# Patient Record
Sex: Female | Born: 1990 | Race: White | Hispanic: No | Marital: Single | Smoking: Never smoker
Health system: Southern US, Community
[De-identification: ages and names within clinical notes are randomized; demographics above are authoritative.]

## PROBLEM LIST (undated history)

## (undated) DIAGNOSIS — F32A Depression, unspecified: Secondary | ICD-10-CM

## (undated) DIAGNOSIS — F419 Anxiety disorder, unspecified: Secondary | ICD-10-CM

## (undated) DIAGNOSIS — T7840XA Allergy, unspecified, initial encounter: Secondary | ICD-10-CM

## (undated) HISTORY — DX: Allergy, unspecified, initial encounter: T78.40XA

## (undated) HISTORY — DX: Depression, unspecified: F32.A

## (undated) HISTORY — PX: DENTAL SURGERY: SHX609

## (undated) HISTORY — DX: Anxiety disorder, unspecified: F41.9

---

## 2013-11-26 ENCOUNTER — Ambulatory Visit: Payer: BC Managed Care – PPO

## 2013-11-26 ENCOUNTER — Ambulatory Visit: Payer: BC Managed Care – PPO | Admitting: Family Medicine

## 2013-11-26 VITALS — BP 112/72 | HR 81 | Temp 98.3°F | Resp 16 | Ht 65.75 in | Wt 167.4 lb

## 2013-11-26 DIAGNOSIS — M79609 Pain in unspecified limb: Secondary | ICD-10-CM

## 2013-11-26 DIAGNOSIS — M79672 Pain in left foot: Secondary | ICD-10-CM

## 2013-11-26 DIAGNOSIS — M25579 Pain in unspecified ankle and joints of unspecified foot: Secondary | ICD-10-CM

## 2013-11-26 DIAGNOSIS — M25572 Pain in left ankle and joints of left foot: Secondary | ICD-10-CM

## 2013-11-26 NOTE — Progress Notes (Signed)
Subjective: 23 year old lady who [place] [facility] [place] [facility] She tripped and twisted her left foot. He has had black and blue it continues to hurt. No prior injury. She is able to walk on it and it hurts.  Objective:  More I do not seem terribly tender. Foot has is swollen and has a bruised appearance on both the dorsal and ventral surfaces she has tenderness in the lateral aspect of the arch. There is pain on inversion, less pain on eversion. Dorsiflexion hurts a little bit and plantarflexion a little bit. The pain is not nearly as bad as the foot looks.  Assessment: Sprain right foot, rule out fracture Pain in the ankle  Plan: xrays  [facility] reading (PRIMARY) by  Dr. Alwyn RenHopper No fracture foot or ankle noted  Treat symptomatically with ice, NSAIDs,

## 2013-11-26 NOTE — Patient Instructions (Signed)
Wear the Ace wrap for a week or 10 days  Ibuprofen as needed for pain. Map maximum 2400 mg in 24 hour.  Return if not significantly improved in about 10-14 days  . Avoid vigorous exercises for another week or so.  Elevate as able

## 2014-04-15 ENCOUNTER — Encounter (HOSPITAL_COMMUNITY): Payer: Self-pay | Admitting: Emergency Medicine

## 2014-04-15 ENCOUNTER — Emergency Department (HOSPITAL_COMMUNITY): Payer: BC Managed Care – PPO

## 2014-04-15 ENCOUNTER — Emergency Department (HOSPITAL_COMMUNITY)
Admission: EM | Admit: 2014-04-15 | Discharge: 2014-04-16 | Disposition: A | Discharge status: Home / Self Care | Payer: BC Managed Care – PPO | Attending: Emergency Medicine | Admitting: Emergency Medicine

## 2014-04-15 DIAGNOSIS — R1031 Right lower quadrant pain: Secondary | ICD-10-CM | POA: Insufficient documentation

## 2014-04-15 DIAGNOSIS — R109 Unspecified abdominal pain: Secondary | ICD-10-CM

## 2014-04-15 DIAGNOSIS — Z3202 Encounter for pregnancy test, result negative: Secondary | ICD-10-CM | POA: Insufficient documentation

## 2014-04-15 DIAGNOSIS — K5900 Constipation, unspecified: Secondary | ICD-10-CM | POA: Insufficient documentation

## 2014-04-15 DIAGNOSIS — Z79899 Other long term (current) drug therapy: Secondary | ICD-10-CM | POA: Insufficient documentation

## 2014-04-15 DIAGNOSIS — R197 Diarrhea, unspecified: Secondary | ICD-10-CM | POA: Insufficient documentation

## 2014-04-15 LAB — POC URINE PREG, ED: Preg Test, Ur: NEGATIVE

## 2014-04-15 LAB — COMPREHENSIVE METABOLIC PANEL
ALBUMIN: 3.7 g/dL (ref 3.5–5.2)
ALT: 17 U/L (ref 0–35)
AST: 20 U/L (ref 0–37)
Alkaline Phosphatase: 92 U/L (ref 39–117)
BILIRUBIN TOTAL: 0.3 mg/dL (ref 0.3–1.2)
BUN: 10 mg/dL (ref 6–23)
CHLORIDE: 103 meq/L (ref 96–112)
CO2: 24 mEq/L (ref 19–32)
CREATININE: 0.56 mg/dL (ref 0.50–1.10)
Calcium: 9.9 mg/dL (ref 8.4–10.5)
GFR calc Af Amer: >90 mL/min (ref >90)
GFR calc non Af Amer: >90 mL/min (ref >90)
Glucose, Bld: 88 mg/dL (ref 70–99)
POTASSIUM: 4.3 meq/L (ref 3.7–5.3)
Sodium: 142 mEq/L (ref 137–147)
TOTAL PROTEIN: 7.7 g/dL (ref 6.0–8.3)

## 2014-04-15 LAB — CBC WITH DIFFERENTIAL/PLATELET
BASOS ABS: 0 10*3/uL (ref 0.0–0.1)
Basophils Relative: 0 % (ref 0–1)
EOS ABS: 0.1 10*3/uL (ref 0.0–0.7)
Eosinophils Relative: 1 % (ref 0–5)
HCT: 38.8 % (ref 36.0–46.0)
Hemoglobin: 12.6 g/dL (ref 12.0–15.0)
Lymphocytes Relative: 34 % (ref 12–46)
Lymphs Abs: 3.3 10*3/uL (ref 0.7–4.0)
MCH: 29.2 pg (ref 26.0–34.0)
MCHC: 32.5 g/dL (ref 30.0–36.0)
MCV: 90 fL (ref 78.0–100.0)
MONO ABS: 0.6 10*3/uL (ref 0.1–1.0)
MONOS PCT: 6 % (ref 3–12)
NEUTROS PCT: 59 % (ref 43–77)
Neutro Abs: 5.5 10*3/uL (ref 1.7–7.7)
Platelets: 305 10*3/uL (ref 150–400)
RBC: 4.31 MIL/uL (ref 3.87–5.11)
RDW: 13.1 % (ref 11.5–15.5)
WBC: 9.5 10*3/uL (ref 4.0–10.5)

## 2014-04-15 LAB — URINE MICROSCOPIC-ADD ON

## 2014-04-15 LAB — URINALYSIS, ROUTINE W REFLEX MICROSCOPIC
Bilirubin Urine: NEGATIVE
GLUCOSE, UA: NEGATIVE mg/dL
Ketones, ur: NEGATIVE mg/dL
LEUKOCYTES UA: NEGATIVE
Nitrite: NEGATIVE
Protein, ur: NEGATIVE mg/dL
Specific Gravity, Urine: 1.015 (ref 1.005–1.030)
Urobilinogen, UA: 0.2 mg/dL (ref 0.0–1.0)
pH: 6.5 (ref 5.0–8.0)

## 2014-04-15 MED ORDER — IOHEXOL 300 MG/ML  SOLN
25.0000 mL | Freq: Once | INTRAMUSCULAR | Status: AC | PRN
  Administered 2014-04-15: 25 mL via ORAL

## 2014-04-15 MED ORDER — HYDROCODONE-ACETAMINOPHEN 5-325 MG PO TABS: 1.0000 | ORAL_TABLET | ORAL | Status: DC | PRN

## 2014-04-15 MED ORDER — MORPHINE SULFATE 4 MG/ML IJ SOLN
2.0000 mg | Freq: Once | INTRAMUSCULAR | Status: AC
  Administered 2014-04-15: 2 mg via INTRAVENOUS
  Filled 2014-04-15: qty 1

## 2014-04-15 MED ORDER — IOHEXOL 300 MG/ML  SOLN
100.0000 mL | Freq: Once | INTRAMUSCULAR | Status: AC | PRN
Start: 2014-04-15 — End: 2014-04-15
  Administered 2014-04-15: 100 mL via INTRAVENOUS

## 2014-04-15 NOTE — Discharge Instructions (Signed)
Take Vicodin as needed for severe pain - Please be careful with this medication.  It can cause drowsiness.  Use caution while driving, operating machinery, drinking alcohol, or any other activities that may impair your physical or mental abilities.   Return to the emergency department if you develop any changing/worsening condition, fever, change or worsening pain, repeated vomiting, feeling ill or worse or any other concerns (please read additional information regarding your condition below)   Abdominal Pain, Women Abdominal (stomach, pelvic, or belly) pain can be caused by many things. It is important to tell your doctor:  The location of the pain.  Does it come and go or is it present all the time?  Are there things that start the pain (eating certain foods, exercise)?  Are there other symptoms associated with the pain (fever, nausea, vomiting, diarrhea)? All of this is helpful to know when trying to find the cause of the pain. CAUSES   Stomach: virus or bacteria infection, or ulcer.  Intestine: appendicitis (inflamed appendix), regional ileitis (Crohn's disease), ulcerative colitis (inflamed colon), irritable bowel syndrome, diverticulitis (inflamed diverticulum of the colon), or cancer of the stomach or intestine.  Gallbladder disease or stones in the gallbladder.  Kidney disease, kidney stones, or infection.  Pancreas infection or cancer.  Fibromyalgia (pain disorder).  Diseases of the female organs:  Uterus: fibroid (non-cancerous) tumors or infection.  Fallopian tubes: infection or tubal pregnancy.  Ovary: cysts or tumors.  Pelvic adhesions (scar tissue).  Endometriosis (uterus lining tissue growing in the pelvis and on the pelvic organs).  Pelvic congestion syndrome (female organs filling up with blood just before the menstrual period).  Pain with the menstrual period.  Pain with ovulation (producing an egg).  Pain with an IUD (intrauterine device, birth  control) in the uterus.  Cancer of the female organs.  Functional pain (pain not caused by a disease, may improve without treatment).  Psychological pain.  Depression. DIAGNOSIS  Your doctor will decide the seriousness of your pain by doing an examination.  Blood tests.  X-rays.  Ultrasound.  CT scan (computed tomography, special type of X-ray).  MRI (magnetic resonance imaging).  Cultures, for infection.  Barium enema (dye inserted in the large intestine, to better view it with X-rays).  Colonoscopy (looking in intestine with a lighted tube).  Laparoscopy (minor surgery, looking in abdomen with a lighted tube).  Major abdominal exploratory surgery (looking in abdomen with a large incision). TREATMENT  The treatment will depend on the cause of the pain.   Many cases can be observed and treated at home.  Over-the-counter medicines recommended by your caregiver.  Prescription medicine.  Antibiotics, for infection.  Birth control pills, for painful periods or for ovulation pain.  Hormone treatment, for endometriosis.  Nerve blocking injections.  Physical therapy.  Antidepressants.  Counseling with a psychologist or psychiatrist.  Minor or major surgery. HOME CARE INSTRUCTIONS   Do not take laxatives, unless directed by your caregiver.  Take over-the-counter pain medicine only if ordered by your caregiver. Do not take aspirin because it can cause an upset stomach or bleeding.  Try a clear liquid diet (broth or water) as ordered by your caregiver. Slowly move to a bland diet, as tolerated, if the pain is related to the stomach or intestine.  Have a thermometer and take your temperature several times a day, and record it.  Bed rest and sleep, if it helps the pain.  Avoid sexual intercourse, if it causes pain.  Avoid stressful situations.  Keep your follow-up appointments and tests, as your caregiver orders.  If the pain does not go away with medicine  or surgery, you may try:  Acupuncture.  Relaxation exercises (yoga, meditation).  Group therapy.  Counseling. SEEK MEDICAL CARE IF:   You notice certain foods cause stomach pain.  Your home care treatment is not helping your pain.  You need stronger pain medicine.  You want your IUD removed.  You feel faint or lightheaded.  You develop nausea and vomiting.  You develop a rash.  You are having side effects or an allergy to your medicine. SEEK IMMEDIATE MEDICAL CARE IF:   Your pain does not go away or gets worse.  You have a fever.  Your pain is felt only in portions of the abdomen. The right side could possibly be appendicitis. The left lower portion of the abdomen could be colitis or diverticulitis.  You are passing blood in your stools (bright red or black tarry stools, with or without vomiting).  You have blood in your urine.  You develop chills, with or without a fever.  You pass out. MAKE SURE YOU:   Understand these instructions.  Will watch your condition.  Will get help right away if you are not doing well or get worse. Document Released: 08/19/2007 Document Revised: 01/14/2012 Document Reviewed: 09/08/2009 [facility] Patient Information 2014 [place].

## 2014-04-15 NOTE — ED Notes (Signed)
The patient went to urgent care and was sent here due to abdominal pain, rule out appendicitis.  The patient denies N/V, diarrhea or any other symptoms other than right lower abdominal pain.

## 2014-04-15 NOTE — ED Provider Notes (Signed)
CSN: [id number]     Arrival date & time 04/15/14  1632 History   First MD Initiated Contact with Patient 04/15/14 2102     Chief Complaint  Patient presents with  . Abdominal Pain    The patient went to urgent care and was sent here due to abdominal pain, rule out appendicitis    HPI  Ruth Hogan is a 23 y.o. female with no PMH who presents to the ED for evaluation of abdominal pain. History was provided by the patient. Patient states she developed non-radiating RLQ pain when she woke up this morning. Pain has been constant and gradually worsening. Pain is described as an aching pain worse with movement. She did not take anything for pain. Pain improved by sitting upright. States her pain became worse at work and went to [facility] for further evaluation. Patient sent to ED to rule out appendicitis. Patient denies any previous abdominal surgeries. She otherwise has been feeling well. No fevers, chills, change in appetite/activity, nausea, emesis, dysuria, or vaginal discharge. Has alternating constipation and diarrhea at baseline with a normal BM yesterday. She is currently on her menstrual cycle. She denies any previous sexual history. States recently started a new kickboxing routine but denies any trauma/injuries.    History reviewed. No pertinent past medical history. Past Surgical History  Procedure Laterality Date  . Dental surgery     History reviewed. No pertinent family history. History  Substance Use Topics  . Smoking status: Never Smoker   . Smokeless tobacco: Not on file  . Alcohol Use: 0.5 - 1.0 oz/week    1-2 drink(s) per week   OB History   Grav Para Term Preterm Abortions TAB SAB Ect Mult Living                 Review of Systems  Constitutional: Negative for fever, chills, activity change, appetite change and fatigue.  Respiratory: Negative for cough and shortness of breath.   Cardiovascular: Negative for chest pain and leg swelling.  Gastrointestinal: Positive for  abdominal pain, diarrhea and constipation. Negative for nausea, vomiting and blood in stool.  Genitourinary: Positive for vaginal bleeding. Negative for dysuria, frequency, flank pain, decreased urine volume, vaginal discharge, difficulty urinating, genital sores, vaginal pain and pelvic pain.  Musculoskeletal: Negative for back pain.  Neurological: Negative for dizziness, weakness, light-headedness and headaches.     Allergies  Review of patient's allergies indicates no known allergies.  Home Medications   Prior to Admission medications   Medication Sig Start Date End Date Taking? Authorizing Provider  fexofenadine (ALLEGRA) 180 MG tablet Take 180 mg by mouth daily.   Yes Historical Provider, MD  Norgestimate-Ethinyl Estradiol Triphasic (ORTHO TRI-CYCLEN LO) 0.18/0.215/0.25 MG-25 MCG tab Take 1 tablet by mouth daily.   Yes Historical Provider, MD   BP 118/85  Pulse 81  Temp(Src) 97.9 F (36.6 C) (Oral)  Resp 14  Ht 5\' 6"  (1.676 m)  Wt 177 lb (80.287 kg)  BMI 28.58 kg/m2  SpO2 99%  LMP 04/15/2014  Filed Vitals:   04/15/14 2131 04/15/14 2145 04/15/14 2245 04/15/14 2355  BP: 119/78 122/73 118/85 106/54  Pulse: 60 68 81 67  Temp:      TempSrc:      Resp: 20 14  16   Height:      Weight:      SpO2: 100% 99% 99% 98%    Physical Exam  Nursing note and vitals reviewed. Constitutional: She is oriented to person, place, and time. She  appears well-developed and well-nourished. No distress.  HENT:  Head: Normocephalic and atraumatic.  Right Ear: External ear normal.  Left Ear: External ear normal.  Mouth/Throat: Oropharynx is clear and moist.  Eyes: Conjunctivae are normal. Right eye exhibits no discharge. Left eye exhibits no discharge.  Neck: Normal range of motion. Neck supple.  Cardiovascular: Normal rate, regular rhythm and normal heart sounds.  Exam reveals no gallop and no friction rub.   No murmur heard. Pulmonary/Chest: Effort normal and breath sounds normal. No  respiratory distress. She has no wheezes. She has no rales. She exhibits no tenderness.  Abdominal: Soft. She exhibits no distension and no mass. There is tenderness. There is no rebound and no guarding.  Mild RLQ tenderness to palpation. No peritoneal signs.   Musculoskeletal: Normal range of motion. She exhibits no edema and no tenderness.  Neurological: She is alert and oriented to person, place, and time.  Skin: Skin is warm and dry. She is not diaphoretic.    ED Course  Procedures (including critical care time) Labs Review Labs Reviewed  URINALYSIS, ROUTINE W REFLEX MICROSCOPIC - Abnormal; Notable for the following:    Hgb urine dipstick LARGE (*)    All other components within normal limits  CBC WITH DIFFERENTIAL  COMPREHENSIVE METABOLIC PANEL  URINE MICROSCOPIC-ADD ON  POC URINE PREG, ED    Imaging Review Ct Abdomen Pelvis W Contrast  04/15/2014   CLINICAL DATA:  Right lower quadrant pain.  EXAM: CT ABDOMEN AND PELVIS WITH CONTRAST  TECHNIQUE: Multidetector CT imaging of the abdomen and pelvis was performed using the standard protocol following bolus administration of intravenous contrast.  CONTRAST:  OMNIPAQUE IOHEXOL 300 MG/ML  SOLN  COMPARISON:  None.  FINDINGS: BODY WALL: Unremarkable.  LOWER CHEST: Unremarkable.  ABDOMEN/PELVIS:  Liver: No focal abnormality.  Biliary: No evidence of biliary obstruction or stone.  Pancreas: Unremarkable.  Spleen: Unremarkable.  Adrenals: Unremarkable.  Kidneys and ureters: No hydronephrosis or stone.  Bladder: Unremarkable.  Reproductive: Unremarkable.  Tampon.  Bowel: No obstruction. The appendix cannot be discretely visualize, but there is no significant appendiceal inflammation.  Retroperitoneum: No mass or adenopathy.  Peritoneum: No free fluid or gas.  Vascular: No acute abnormality.  OSSEOUS: No acute abnormalities.  IMPRESSION: Negative exam. No evidence for appendicitis or other inflammatory process in the abdomen or pelvis.    Electronically Signed   By: Davonna Belling M.D.   On: 04/15/2014 22:53     EKG Interpretation None      Results for orders placed during the hospital encounter of 04/15/14  CBC WITH DIFFERENTIAL      Result Value Ref Range   WBC 9.5  4.0 - 10.5 K/uL   RBC 4.31  3.87 - 5.11 MIL/uL   Hemoglobin 12.6  12.0 - 15.0 g/dL   HCT 50.1  58.6 - 82.5 %   MCV 90.0  78.0 - 100.0 fL   MCH 29.2  26.0 - 34.0 pg   MCHC 32.5  30.0 - 36.0 g/dL   RDW 74.9  35.5 - 21.7 %   Platelets 305  150 - 400 K/uL   Neutrophils Relative % 59  43 - 77 %   Neutro Abs 5.5  1.7 - 7.7 K/uL   Lymphocytes Relative 34  12 - 46 %   Lymphs Abs 3.3  0.7 - 4.0 K/uL   Monocytes Relative 6  3 - 12 %   Monocytes Absolute 0.6  0.1 - 1.0 K/uL   Eosinophils Relative 1  0 - 5 %   Eosinophils Absolute 0.1  0.0 - 0.7 K/uL   Basophils Relative 0  0 - 1 %   Basophils Absolute 0.0  0.0 - 0.1 K/uL  COMPREHENSIVE METABOLIC PANEL      Result Value Ref Range   Sodium 142  137 - 147 mEq/L   Potassium 4.3  3.7 - 5.3 mEq/L   Chloride 103  96 - 112 mEq/L   CO2 24  19 - 32 mEq/L   Glucose, Bld 88  70 - 99 mg/dL   BUN 10  6 - 23 mg/dL   Creatinine, Ser 1.61  0.50 - 1.10 mg/dL   Calcium 9.9  8.4 - 09.6 mg/dL   Total Protein 7.7  6.0 - 8.3 g/dL   Albumin 3.7  3.5 - 5.2 g/dL   AST 20  0 - 37 U/L   ALT 17  0 - 35 U/L   Alkaline Phosphatase 92  39 - 117 U/L   Total Bilirubin 0.3  0.3 - 1.2 mg/dL   GFR calc non Af Amer >90  >90 mL/min   GFR calc Af Amer >90  >90 mL/min  URINALYSIS, ROUTINE W REFLEX MICROSCOPIC      Result Value Ref Range   Color, Urine YELLOW  YELLOW   APPearance CLEAR  CLEAR   Specific Gravity, Urine 1.015  1.005 - 1.030   pH 6.5  5.0 - 8.0   Glucose, UA NEGATIVE  NEGATIVE mg/dL   Hgb urine dipstick LARGE (*) NEGATIVE   Bilirubin Urine NEGATIVE  NEGATIVE   Ketones, ur NEGATIVE  NEGATIVE mg/dL   Protein, ur NEGATIVE  NEGATIVE mg/dL   Urobilinogen, UA 0.2  0.0 - 1.0 mg/dL   Nitrite NEGATIVE  NEGATIVE   Leukocytes,  UA NEGATIVE  NEGATIVE  URINE MICROSCOPIC-ADD ON      Result Value Ref Range   Squamous Epithelial / LPF RARE  RARE   RBC / HPF TOO NUMEROUS TO COUNT  <3 RBC/hpf   Bacteria, UA RARE  RARE  POC URINE PREG, ED      Result Value Ref Range   Preg Test, Ur NEGATIVE  NEGATIVE    MDM   SYRAH DAUGHTREY is a 23 y.o. female with no PMH who presents to the ED for evaluation of abdominal pain. Etiology of abdominal pain unclear. CT negative for appendicitis or other acute abnormality. Patient had improvements in her pain throughout her ED visit. Abdominal exam benign. Pelvic exam deferred by patient. Doubt PID, ovarian torsion, or other life-threatening gynecological causes. Labs unremarkable. Patient afebrile and non-toxic in appearance. Vital signs stable. Patient has hematuria which is likely due to her menstrual period. Patient given strict return precautions. May require pelvic exam and pelvic US if her pain continues or does not improve. Instructed patient to follow-up with PCP tomorrow and return if she has any changing or worsening symptoms. Patient in agreement with discharge and plan.   Rechecks  11:45 PM = Patient informed of results. Declines pelvic exam. States she feels better, however, pain has not resolved. Patient well appearing and is in no acute distress. Abdominal exam benign. Spoke with patient regarding return precautions.     Discharge Medication List as of 04/15/2014 11:52 PM    START taking these medications   Details  HYDROcodone-acetaminophen (NORCO/VICODIN) 5-325 MG per tablet Take 1 tablet by mouth every 4 (four) hours as needed for moderate pain or severe pain., Starting 04/15/2014, Until Discontinued, Print  Final impressions: 1. Abdominal pain       Luiz IronJessica Katlin Bianca Vester PA-C   This patient was discussed with Dr. Skipper ClicheZackowski         Glennon Kopko K Marian Grandt, PA-C 04/16/14 [phone]

## 2014-04-17 NOTE — ED Provider Notes (Signed)
Medical screening examination/treatment/procedure(s) were performed by non-physician practitioner and as supervising physician I was immediately available for consultation/collaboration.   EKG Interpretation None        Vanetta MuldersScott Jadie Allington, MD 04/17/14 1635

## 2014-07-14 IMAGING — CR DG FOOT COMPLETE 3+V*L*
3 series · 3 of 3 positions shown · non-contrast
Comparison: None.

CLINICAL DATA: Left foot injury, pain.

EXAM:
LEFT FOOT - COMPLETE 3+ VIEW

[AP]
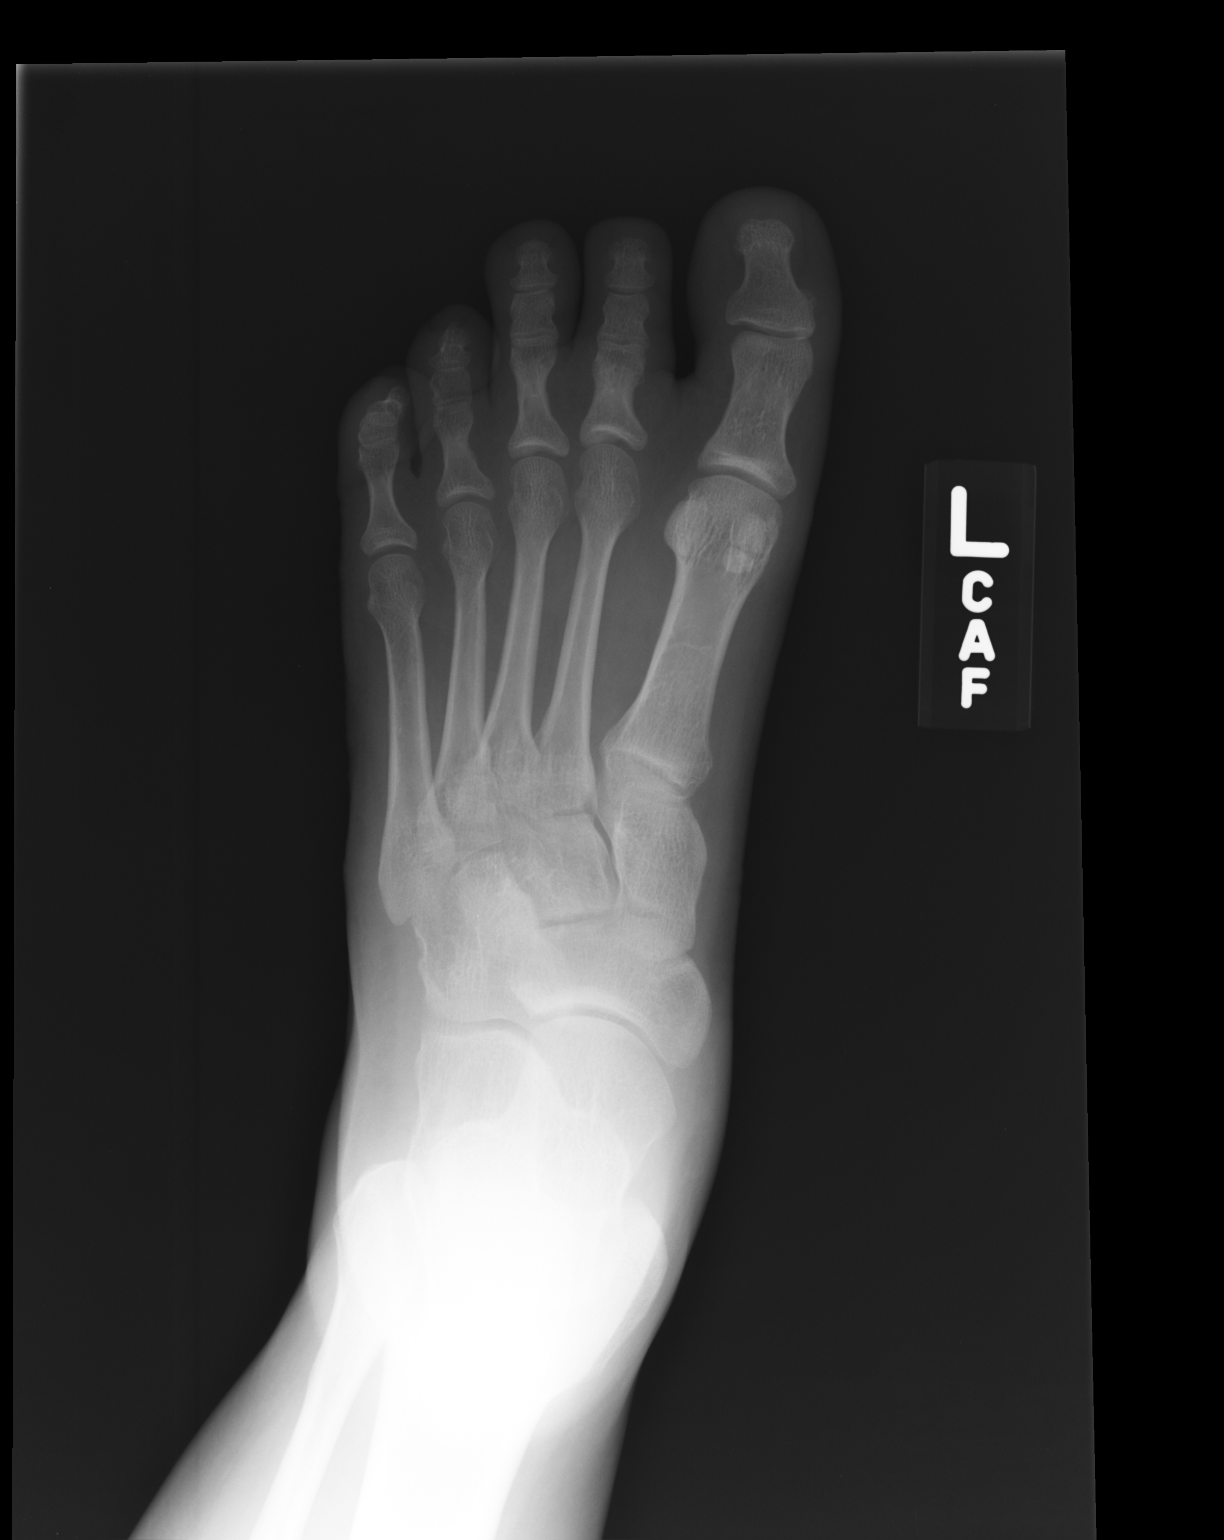

[ap obl int rot]
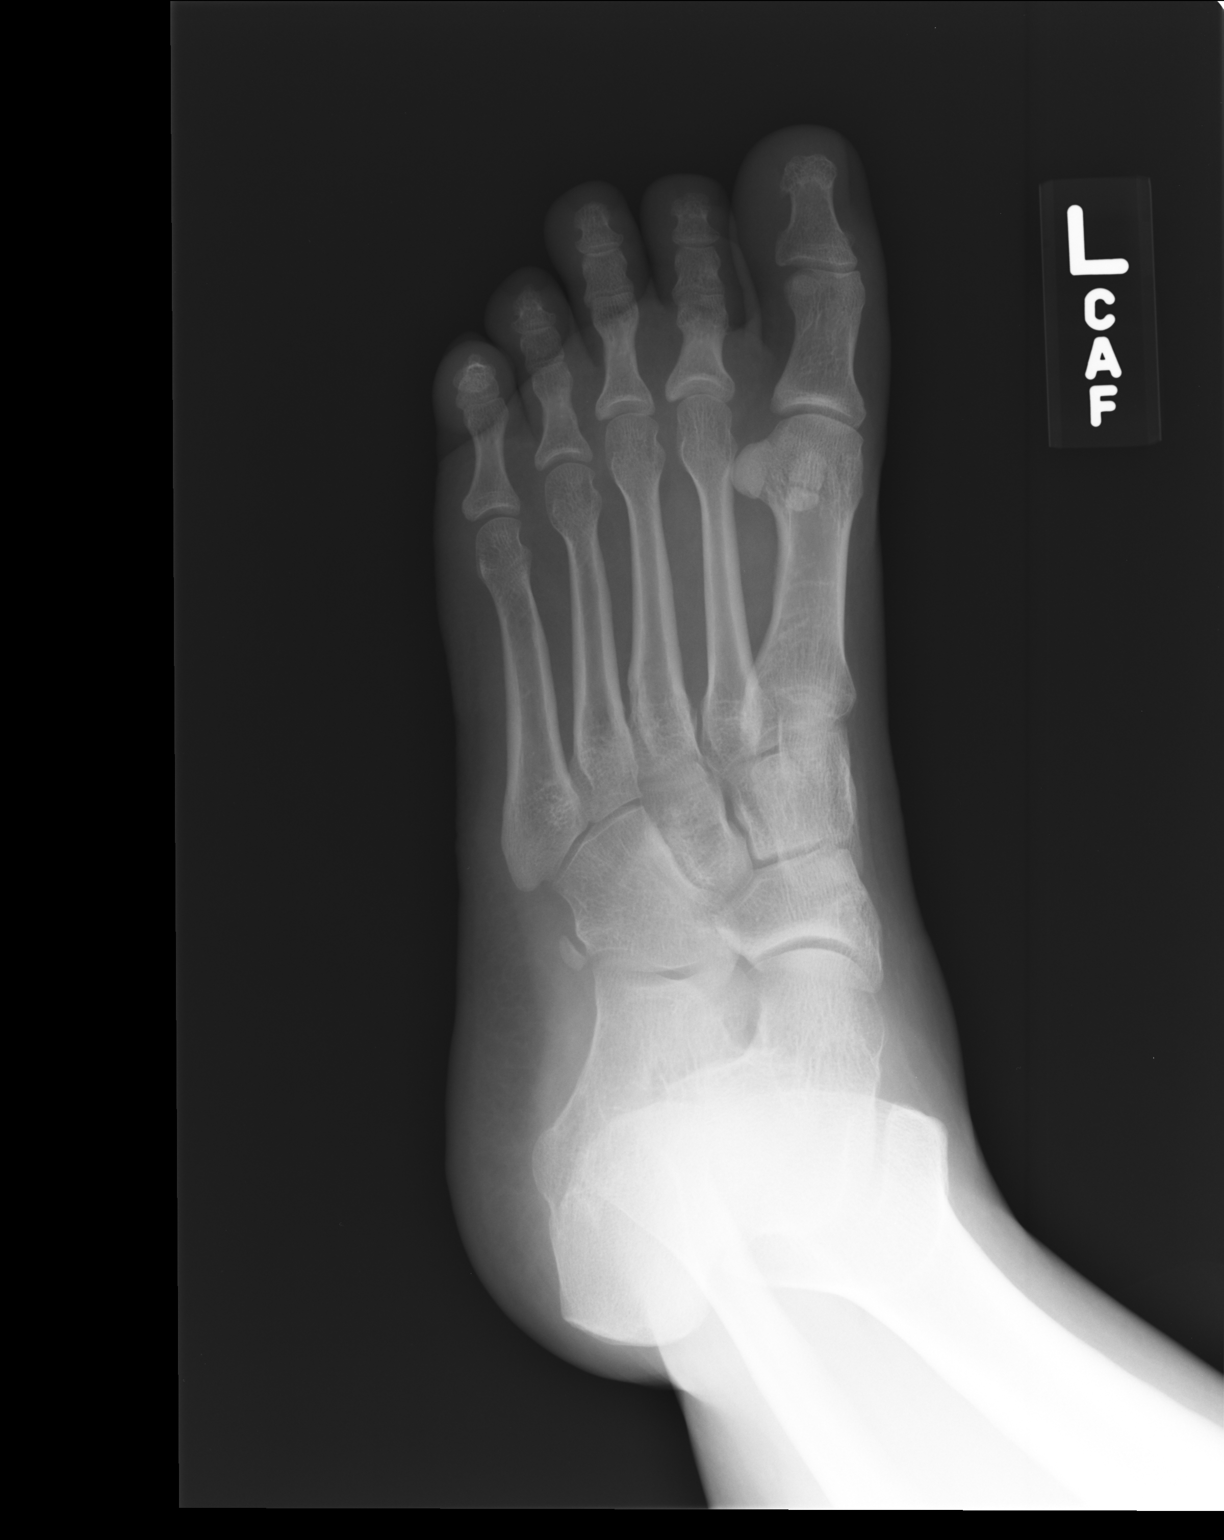

[lateral]
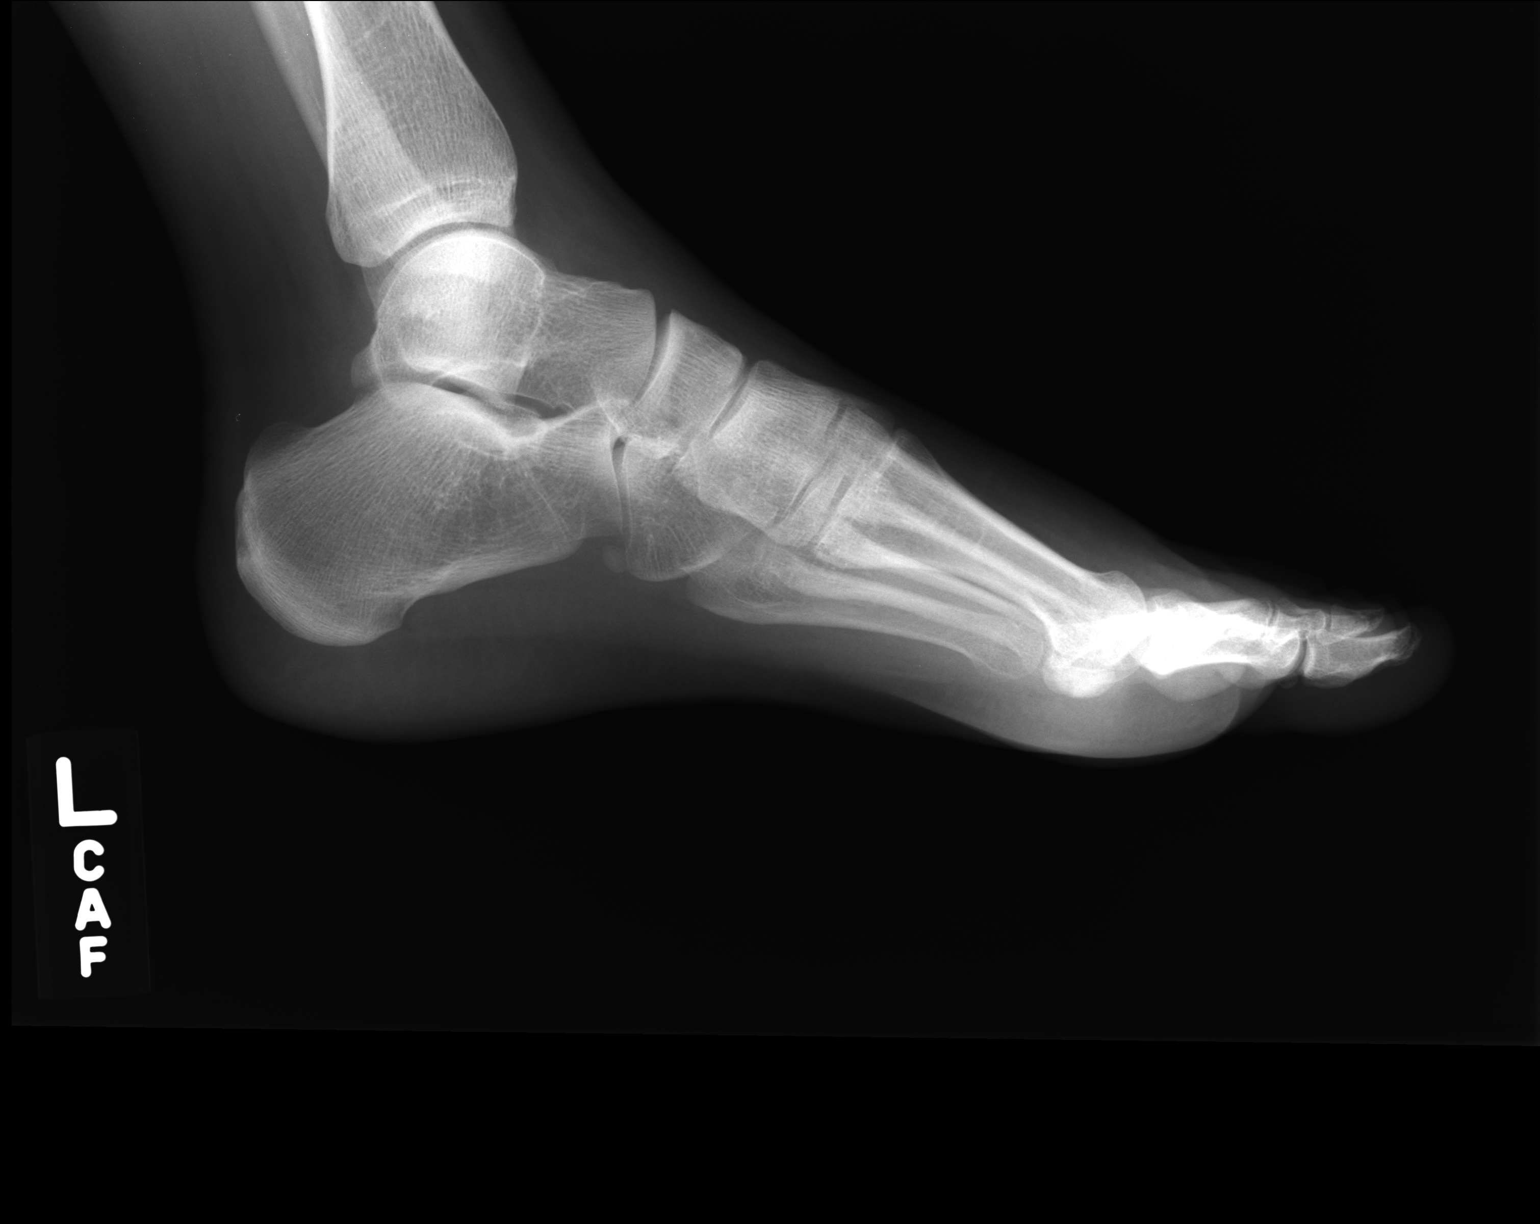

[3 of 3 positions shown; findings below may reference images not displayed]

FINDINGS: Imaged bones, joints and soft tissues appear normal.
IMPRESSION: Negative exam.

## 2014-12-01 IMAGING — CT CT ABD-PELV W/ CM
2 of 4 series · 16 of 46 positions shown, 18 images · IV contrast (Omni 300)
Comparison: None.

CLINICAL DATA: Right lower quadrant pain.

EXAM:
CT ABDOMEN AND PELVIS WITH CONTRAST
TECHNIQUE: Multidetector CT imaging of the abdomen and pelvis was performed
using the standard protocol following bolus administration of
intravenous contrast.
CONTRAST:  100mL OMNIPAQUE IOHEXOL 300 MG/ML  SOLN

[Series 2: abd/ pelvis 5.0 i30f 1 · axial · 0.65mm/px · z∈[+647,+1072]mm · 13 of 93 slices shown, 15 images]
[im 4/93  soft-tissue]
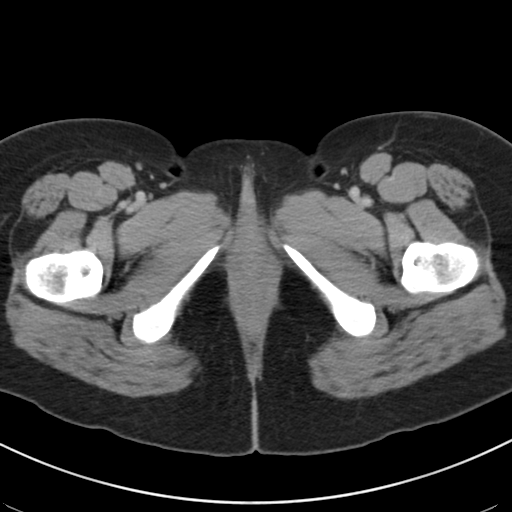
[im 4/93  bone]
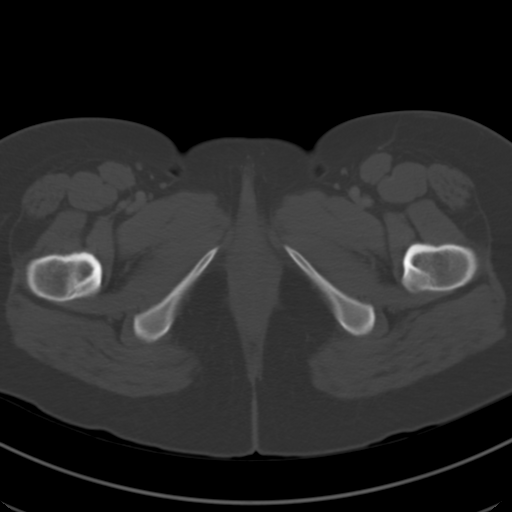
[im 12/93  soft-tissue]
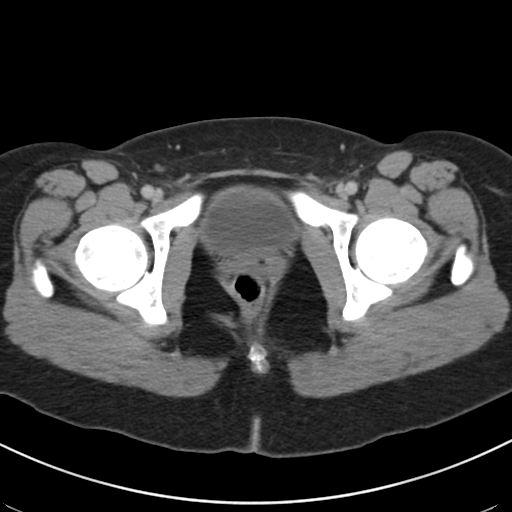
[im 20/93  soft-tissue]
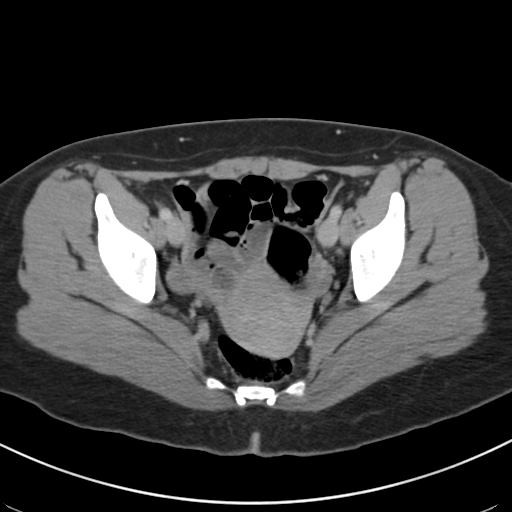
[im 27/93  soft-tissue]
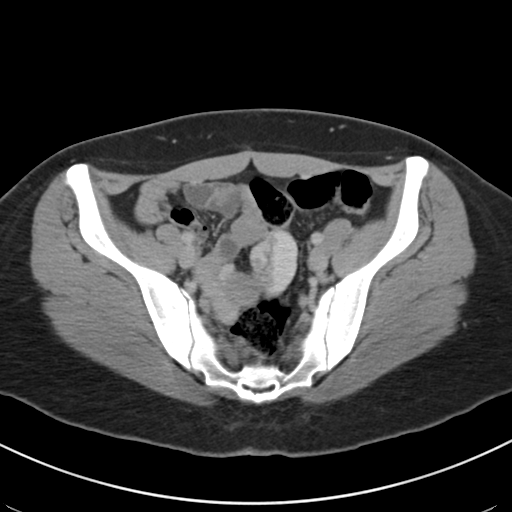
[im 31/93  soft-tissue]
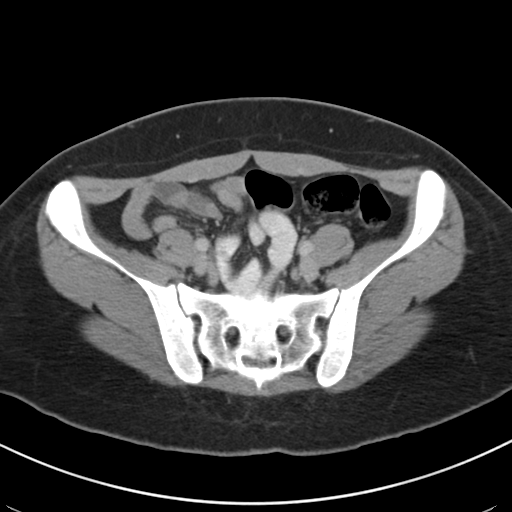
[im 39/93  soft-tissue]
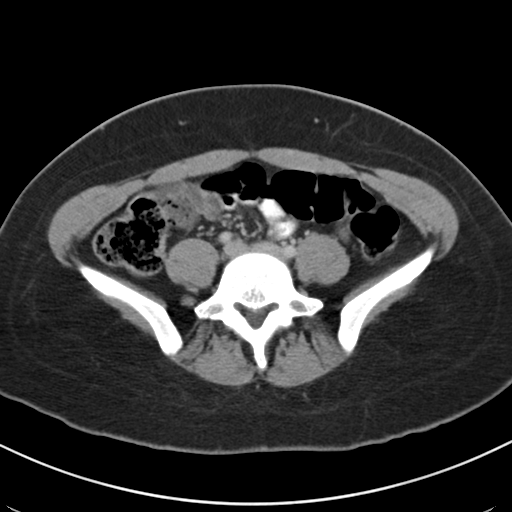
[im 47/93  soft-tissue]
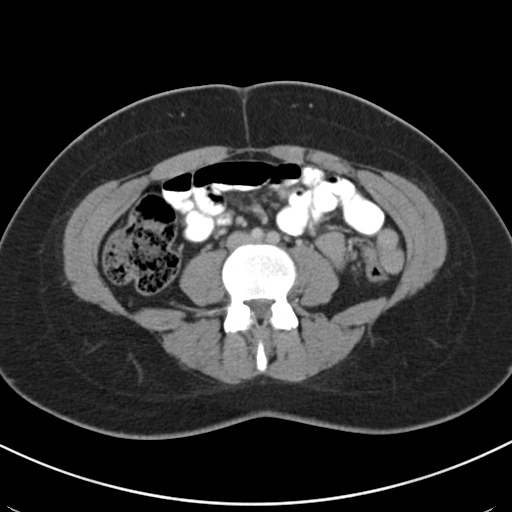
[im 54/93  soft-tissue]
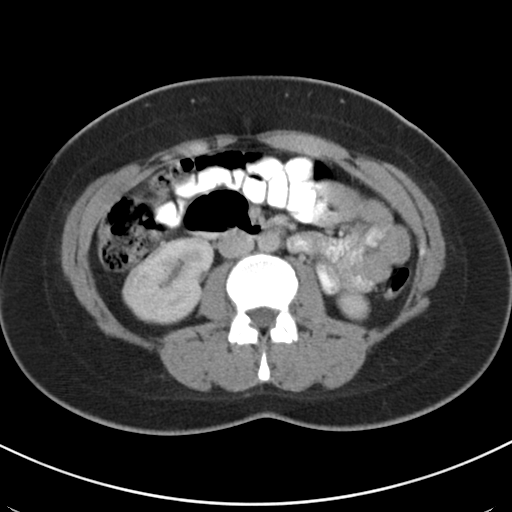
[im 62/93  soft-tissue]
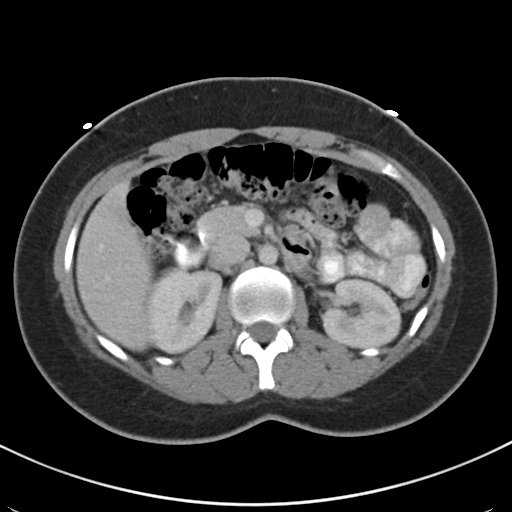
[im 62/93  bone]
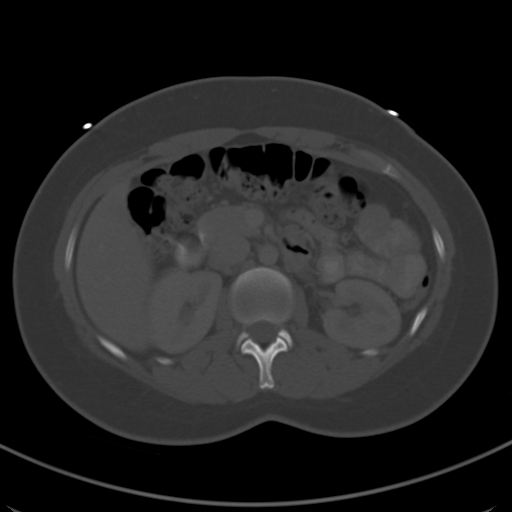
[im 66/93  soft-tissue]
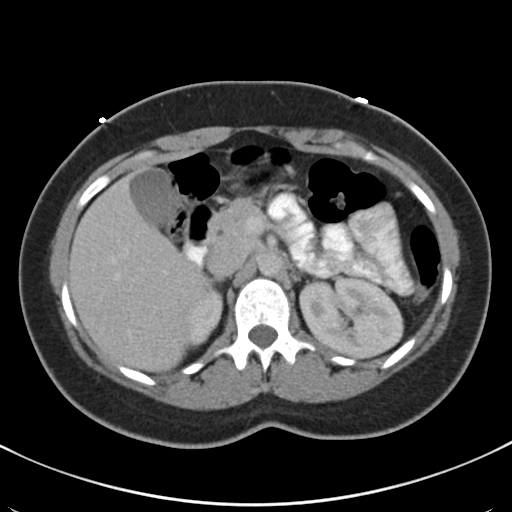
[im 73/93  soft-tissue]
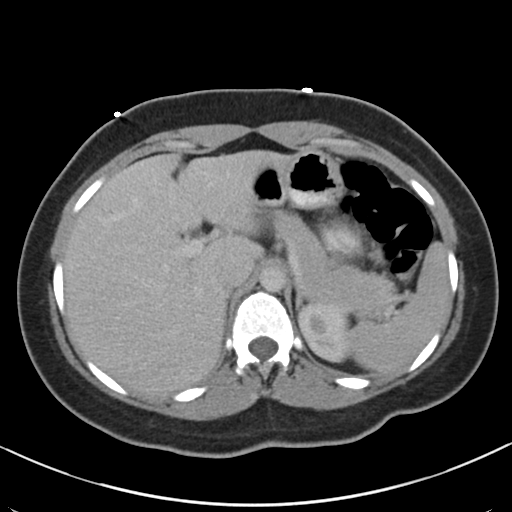
[im 81/93  soft-tissue]
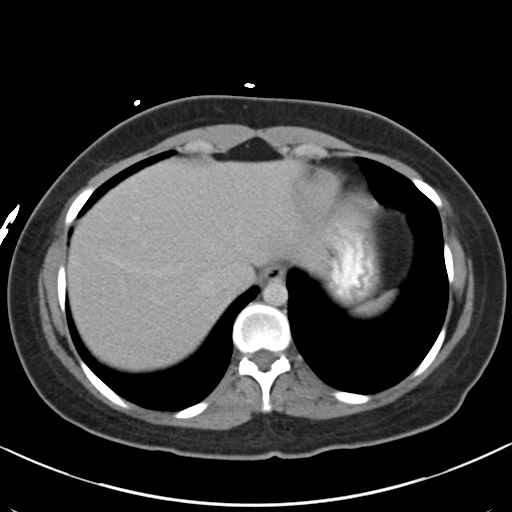
[im 89/93  soft-tissue]
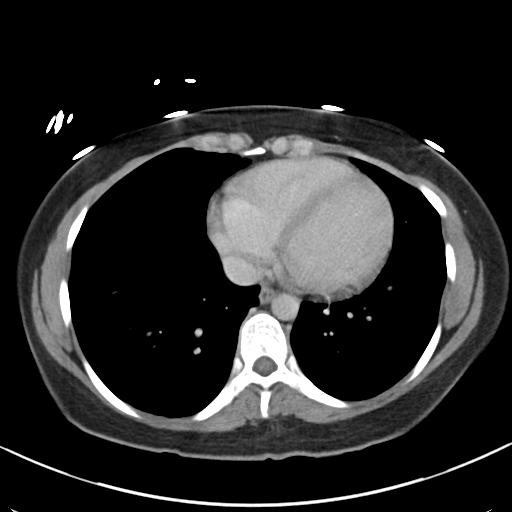

[Series 5: coronals · coronal · 0.70mm/px · 3 of 181 slices shown]
[im 81/181  soft-tissue]
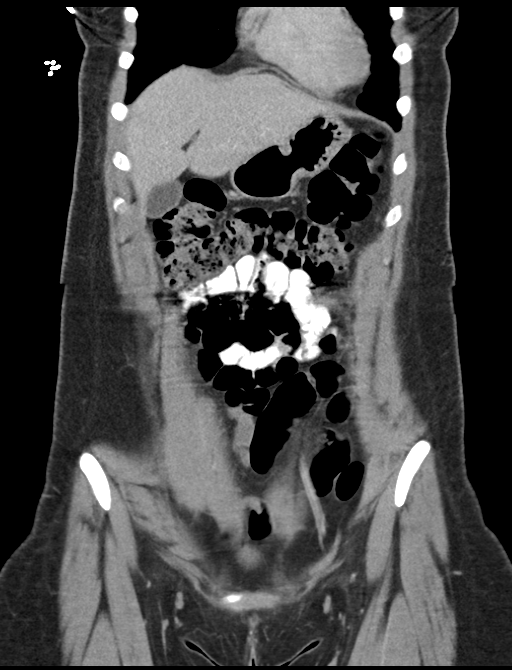
[im 101/181  soft-tissue]
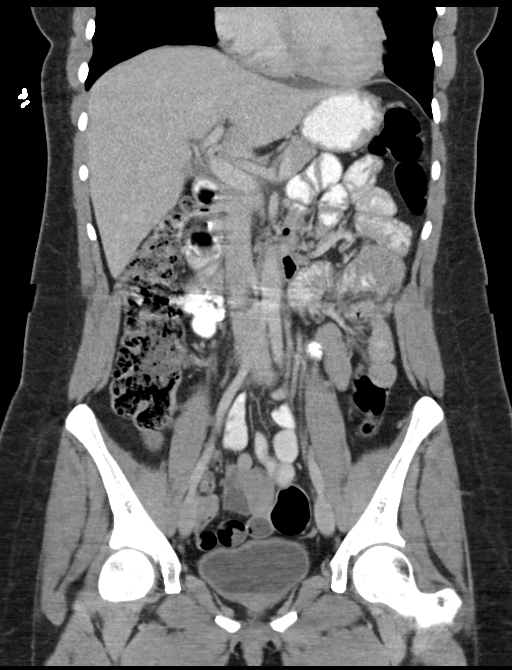
[im 121/181  soft-tissue]
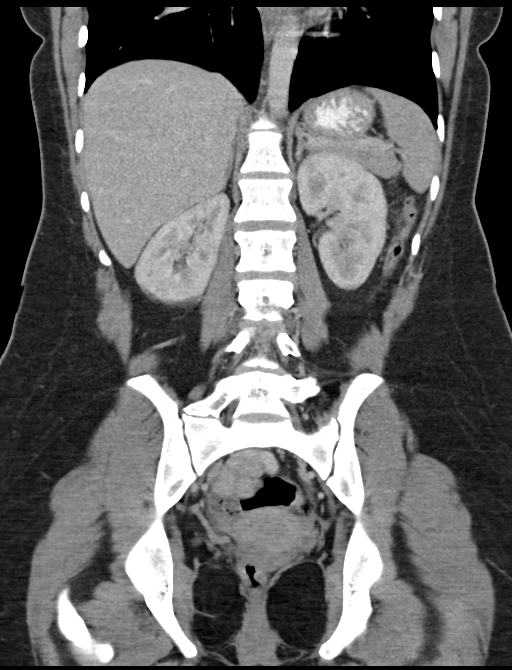

[16 of 46 positions shown; findings below may reference images not displayed]

FINDINGS: BODY WALL: Unremarkable.

LOWER CHEST: Unremarkable.

ABDOMEN/PELVIS:

Liver: No focal abnormality.

Biliary: No evidence of biliary obstruction or stone.

Pancreas: Unremarkable.

Spleen: Unremarkable.

Adrenals: Unremarkable.

Kidneys and ureters: No hydronephrosis or stone.

Bladder: Unremarkable.

Reproductive: Unremarkable.  Tampon.

Bowel: No obstruction. The appendix cannot be discretely visualize,
but there is no significant appendiceal inflammation.

Retroperitoneum: No mass or adenopathy.

Peritoneum: No free fluid or gas.

Vascular: No acute abnormality.

OSSEOUS: No acute abnormalities.
IMPRESSION: Negative exam. No evidence for appendicitis or other inflammatory
process in the abdomen or pelvis.

## 2023-08-14 NOTE — Progress Notes (Signed)
Ruth Hogan is a 32 y.o. female here for a new patient visit.  History of Present Illness:   Chief Complaint  Patient presents with   Establish Care   Insomnia    Pt is having trouble falling asleep and staying asleep, x several years and is worsening. She has tried Melatonin in the past.   stress levels    Pt would like cortisol testing, and therapist recommend ADHD testing.    HPI  Anxiety and Depression She has never been on prescription medication for this. She does not believe she needs medication at this time. Denies history of hospitalization for mental health problems. She has been having increased stress recently related to her job. Reports she has skin breakouts in response to stress. Denies other physical symptoms. She has been doing talk therapy since 2016. She is interested in being tested for ADHD -- has concerns for attention deficit -- this has Denies SI/HI.  Weight Gain Although she doesn't regularly weigh herself, she has noticed significant weight gain over the past few years during the completion of her [facility] program. Reports that she has increased several clothing sizes.  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 She would like blood work to evaluate for this. Family history of diabetes in paternal grandmother. She exercises with a trainer regularly.  Insomnia Reports she has difficulty falling asleep and staying asleep. Staying asleep is the main issue for her. This is a longstanding issue that has been worsening over the past several years. She takes melatonin without relief, felt like she was developing allergic reaction symptom(s) to it.  She is concerned she may have sleep apnea. Family history of sleep apnea with CPAP in father and paternal grandfather.  Tonsil Stones Notes she has been having increasingly larger tonsil stones over the past 5 years. Stones appear mainly during the  summer. She removes these with a toothbrush. Reports Allegra and Flonase help control the number of tonsil stones. She is agreeable to seeing an ENT.  Ankle Stiffness Notes increased stiffness in both ankles over the past two years. Stiffness is worse in the morning. Her job is sedentary. She walks her dog at lunchtime every day. She runs and uses a stair machine for exercise. Stiffness does not change based on activity level. Denies ankle pain, rash. Notes increased swelling during travel. Had an ankle sprain in third grad and denies any other ankle injuries. History of bone spurs in ankles in maternal aunt and mother. History of arthritis hands in maternal grandmother. Notes more mild stiffness in hips. Agreeable to trying PT.  GYN Reports normal menses. Requesting referral for gynecologist.  Vaccine Counseling Tetanus vaccine administered today. She will get flu, Covid-19 boosters through her employer.  She has not seen a primary care physician in several years. Agreeable to doing standard blood work today. Does not remember past blood work.  Past Medical History:  Diagnosis Date   Allergy    Seasonal - would like to investigate more   Anxiety    Situational - in therapy since 2016   Depression    Situational - in therapy since 2016     Social History   Tobacco Use   Smoking status: Never   Smokeless tobacco: Never  Vaping Use   Vaping status: Never Used  Substance Use Topics   Alcohol use: Yes    Alcohol/week: 2.0 - 3.0 standard drinks of alcohol    Types: 1  Cans of beer, 1 - 2 Standard drinks or equivalent per week   Drug use: Never    Past Surgical History:  Procedure Laterality Date   DENTAL SURGERY      Family History  Problem Relation Age of Onset   Rheum arthritis Maternal Grandmother    Colon cancer Maternal Grandfather 86   Diabetes Paternal Grandmother    Miscarriages / Stillbirths Maternal Aunt     No Known Allergies  Current  Medications:   Current Outpatient Medications:    fexofenadine (ALLEGRA) 180 MG tablet, Take 180 mg by mouth daily., Disp: , Rfl:    Review of Systems:   Review of Systems  Constitutional:  Negative for fever and malaise/fatigue.  HENT:  Negative for congestion.        (+) Tonsil stones  Eyes:  Negative for blurred vision.  Respiratory:  Negative for cough and shortness of breath.   Cardiovascular:  Negative for chest pain, palpitations and leg swelling.  Gastrointestinal:  Negative for vomiting.  Musculoskeletal:  Negative for back pain and joint pain (Ankles, Hips).  Skin:  Negative for rash.  Neurological:  Negative for loss of consciousness and headaches.  Psychiatric/Behavioral:  The patient is nervous/anxious and has insomnia.     Vitals:   Vitals:   08/21/23 0814  BP: 110/78  Pulse: 68  Temp: 97.7 F (36.5 C)  TempSrc: Temporal  SpO2: 98%  Height: 5\' 6"  (1.676 m)     Body mass index is 28.57 kg/m.  Physical Exam:   Physical Exam Vitals and nursing note reviewed.  Constitutional:      General: She is not in acute distress.    Appearance: She is well-developed. She is not ill-appearing or toxic-appearing.  Cardiovascular:     Rate and Rhythm: Normal rate and regular rhythm.     Pulses: Normal pulses.     Heart sounds: Normal heart sounds, S1 normal and S2 normal.  Pulmonary:     Effort: Pulmonary effort is normal.     Breath sounds: Normal breath sounds.  Skin:    General: Skin is warm and dry.  Neurological:     Mental Status: She is alert.     GCS: GCS eye subscore is 4. GCS verbal subscore is 5. GCS motor subscore is 6.  Psychiatric:        Speech: Speech normal.        Behavior: Behavior normal. Behavior is cooperative.     Assessment and Plan:   Anxiety and depression Overall controlled Continue talk therapy Declines additional intervention today Denies SI/HI -- continue to monitor  Tonsil stone Will place referral for ENT due to chronic  issues  Attention or concentration deficit Provided resources for local options for testing Follow-up with me if any further concerns or after evaluation  Suspected sleep apnea Referral to [facility] for sleep studies  Encounter for screening for other viral diseases Update hep C  Screening for HIV (human immunodeficiency virus) Update HIV  Joint stiffness of both ankles Referral to physical therapy  Weight gain Update blood work to evaluate for possible organic cause Continue efforts at healthy lifestyle  Vitamin D deficiency Update vitamin D and provide recommendations  Pap smear for cervical cancer screening Referral to gynecology  Need for prophylactic vaccination with combined diphtheria-tetanus-pertussis (DTP) vaccine Tdap administered today    I,Alexander Ruley,acting as a scribe for [employer], PA.,have documented all relevant documentation on the behalf of Jarold Motto, PA,as directed by  Jarold Motto, PA  while in the presence of Jarold Motto, [place].   I, Jarold Motto, [place], have reviewed all documentation for this visit. The documentation on 08/21/23 for the exam, diagnosis, procedures, and orders are all accurate and complete.   Jarold Motto, PA-C

## 2023-08-21 ENCOUNTER — Ambulatory Visit (INDEPENDENT_AMBULATORY_CARE_PROVIDER_SITE_OTHER): Payer: BC Managed Care – PPO | Admitting: Physician Assistant

## 2023-08-21 ENCOUNTER — Encounter: Payer: Self-pay | Admitting: Physician Assistant

## 2023-08-21 VITALS — BP 110/78 | HR 68 | Temp 97.7°F | Ht 66.0 in

## 2023-08-21 DIAGNOSIS — Z114 Encounter for screening for human immunodeficiency virus [HIV]: Secondary | ICD-10-CM

## 2023-08-21 DIAGNOSIS — J358 Other chronic diseases of tonsils and adenoids: Secondary | ICD-10-CM

## 2023-08-21 DIAGNOSIS — R41840 Attention and concentration deficit: Secondary | ICD-10-CM

## 2023-08-21 DIAGNOSIS — M25672 Stiffness of left ankle, not elsewhere classified: Secondary | ICD-10-CM

## 2023-08-21 DIAGNOSIS — F32A Depression, unspecified: Secondary | ICD-10-CM

## 2023-08-21 DIAGNOSIS — Z1159 Encounter for screening for other viral diseases: Secondary | ICD-10-CM

## 2023-08-21 DIAGNOSIS — R635 Abnormal weight gain: Secondary | ICD-10-CM

## 2023-08-21 DIAGNOSIS — M25671 Stiffness of right ankle, not elsewhere classified: Secondary | ICD-10-CM

## 2023-08-21 DIAGNOSIS — R29818 Other symptoms and signs involving the nervous system: Secondary | ICD-10-CM | POA: Diagnosis not present

## 2023-08-21 DIAGNOSIS — Z23 Encounter for immunization: Secondary | ICD-10-CM | POA: Diagnosis not present

## 2023-08-21 DIAGNOSIS — F419 Anxiety disorder, unspecified: Secondary | ICD-10-CM

## 2023-08-21 DIAGNOSIS — Z124 Encounter for screening for malignant neoplasm of cervix: Secondary | ICD-10-CM

## 2023-08-21 DIAGNOSIS — F339 Major depressive disorder, recurrent, unspecified: Secondary | ICD-10-CM | POA: Insufficient documentation

## 2023-08-21 DIAGNOSIS — E559 Vitamin D deficiency, unspecified: Secondary | ICD-10-CM

## 2023-08-21 LAB — COMPREHENSIVE METABOLIC PANEL
ALT: 12 U/L (ref 0–35)
AST: 16 U/L (ref 0–37)
Albumin: 4.2 g/dL (ref 3.5–5.2)
Alkaline Phosphatase: 108 U/L (ref 39–117)
BUN: 9 mg/dL (ref 6–23)
CO2: 23 meq/L (ref 19–32)
Calcium: 9.4 mg/dL (ref 8.4–10.5)
Chloride: 105 meq/L (ref 96–112)
Creatinine, Ser: 0.65 mg/dL (ref 0.40–1.20)
GFR: 115.2 mL/min (ref >60.00)
Glucose, Bld: 84 mg/dL (ref 70–99)
Potassium: 4.1 meq/L (ref 3.5–5.1)
Sodium: 137 meq/L (ref 135–145)
Total Bilirubin: 0.4 mg/dL (ref 0.2–1.2)
Total Protein: 7.1 g/dL (ref 6.0–8.3)

## 2023-08-21 LAB — CBC WITH DIFFERENTIAL/PLATELET
Basophils Absolute: 0 10*3/uL (ref 0.0–0.1)
Basophils Relative: 0.6 % (ref 0.0–3.0)
Eosinophils Absolute: 0.1 10*3/uL (ref 0.0–0.7)
Eosinophils Relative: 1.3 % (ref 0.0–5.0)
HCT: 37.7 % (ref 36.0–46.0)
Hemoglobin: 12 g/dL (ref 12.0–15.0)
Lymphocytes Relative: 24.3 % (ref 12.0–46.0)
Lymphs Abs: 2 10*3/uL (ref 0.7–4.0)
MCHC: 31.7 g/dL (ref 30.0–36.0)
MCV: 86.2 fL (ref 78.0–100.0)
Monocytes Absolute: 0.7 10*3/uL (ref 0.1–1.0)
Monocytes Relative: 8.1 % (ref 3.0–12.0)
Neutro Abs: 5.3 10*3/uL (ref 1.4–7.7)
Neutrophils Relative %: 65.7 % (ref 43.0–77.0)
Platelets: 309 10*3/uL (ref 150.0–400.0)
RBC: 4.37 Mil/uL (ref 3.87–5.11)
RDW: 15.5 % (ref 11.5–15.5)
WBC: 8.1 10*3/uL (ref 4.0–10.5)

## 2023-08-21 LAB — VITAMIN D 25 HYDROXY (VIT D DEFICIENCY, FRACTURES): VITD: 18.3 ng/mL — ABNORMAL LOW (ref 30.00–100.00)

## 2023-08-21 LAB — VITAMIN B12: Vitamin B-12: 279 pg/mL (ref 211–911)

## 2023-08-21 LAB — TSH: TSH: 2.29 u[IU]/mL (ref 0.35–5.50)

## 2023-08-21 LAB — HEMOGLOBIN A1C: Hgb A1c MFr Bld: 5.8 % (ref 4.6–6.5)

## 2023-08-22 ENCOUNTER — Encounter: Payer: Self-pay | Admitting: Physician Assistant

## 2023-08-22 ENCOUNTER — Other Ambulatory Visit: Payer: Self-pay | Admitting: Physician Assistant

## 2023-08-22 DIAGNOSIS — E559 Vitamin D deficiency, unspecified: Secondary | ICD-10-CM | POA: Insufficient documentation

## 2023-08-22 DIAGNOSIS — E88819 Insulin resistance, unspecified: Secondary | ICD-10-CM | POA: Insufficient documentation

## 2023-08-22 LAB — HEPATITIS C ANTIBODY: Hepatitis C Ab: NONREACTIVE

## 2023-08-22 LAB — HIV ANTIBODY (ROUTINE TESTING W REFLEX): HIV 1&2 Ab, 4th Generation: NONREACTIVE

## 2023-08-22 MED ORDER — VITAMIN D (ERGOCALCIFEROL) 1.25 MG (50000 UNIT) PO CAPS: 50000.0000 [IU] | ORAL_CAPSULE | ORAL | 0 refills | Status: DC

## 2023-08-26 ENCOUNTER — Ambulatory Visit (INDEPENDENT_AMBULATORY_CARE_PROVIDER_SITE_OTHER): Payer: BC Managed Care – PPO | Admitting: Physical Therapy

## 2023-08-26 ENCOUNTER — Encounter: Payer: Self-pay | Admitting: Physician Assistant

## 2023-08-26 DIAGNOSIS — M25571 Pain in right ankle and joints of right foot: Secondary | ICD-10-CM | POA: Diagnosis not present

## 2023-08-26 DIAGNOSIS — M25572 Pain in left ankle and joints of left foot: Secondary | ICD-10-CM

## 2023-08-26 NOTE — Therapy (Signed)
OUTPATIENT PHYSICAL THERAPY LOWER EXTREMITY EVALUATION   Patient Name: Ruth Hogan MRN: [id number] DOB:1991/10/22, 32 y.o., female Today's Date: 08/26/2023  END OF SESSION:  PT End of Session - 09/02/23 1020     Visit Number 1    Number of Visits 12    Date for PT Re-Evaluation 10/21/23    Authorization Type BCBS    PT Start Time 1600    PT Stop Time 1641    PT Time Calculation (min) 41 min    Activity Tolerance Patient tolerated treatment well    Behavior During Therapy WFL for tasks assessed/performed             Past Medical History:  Diagnosis Date   Allergy    Seasonal - would like to investigate more   Anxiety    Situational - in therapy since 2016   Depression    Situational - in therapy since 2016   Past Surgical History:  Procedure Laterality Date   DENTAL SURGERY     Patient Active Problem List   Diagnosis Date Noted   Insulin resistance 08/22/2023   Vitamin D deficiency 08/22/2023   Anxiety and depression 08/21/2023    PCP: Jarold Motto  REFERRING PROVIDER: Jarold Motto   REFERRING DIAG: Bil ankle stiffness   THERAPY DIAG:  Pain in left ankle and joints of left foot  Pain in right ankle and joints of right foot  Rationale for Evaluation and Treatment: Rehabilitation  ONSET DATE: 3 mo ago   SUBJECTIVE:   SUBJECTIVE STATEMENT: Pt states newer and worsening symptom of stiffness feeling in Both ankles. States stiffness feeling when she first wakes up, difficulty for initial mobility and going down stairs in the morning. She denies any actual pain or any recent injury. She is active, likes Running regularly , sprints, and hiking., some weights . She has a Health and safety inspector job.  PERTINENT HISTORY: none   PAIN:  Are you having pain? Yes: NPRS scale: 0/10 Pain location: bil ankles  Pain description: stiffness Aggravating factors: first thing in am, going down stairs  Relieving factors: none stated    PRECAUTIONS: None  WEIGHT  BEARING RESTRICTIONS: No  FALLS:  Has patient fallen in last 6 months? No   PLOF: Independent  PATIENT GOALS:  Decreased pain   NEXT MD VISIT:   OBJECTIVE:   DIAGNOSTIC FINDINGS:   PATIENT SURVEYS:   COGNITION: Overall cognitive status: Within functional limits for tasks assessed     SENSATION: WFL  EDEMA:    POSTURE:    No Significant postural limitations, standing: foot posture: neutral foot/arch.   PALPATION: mild hypomobility of bil ankle post glide   LOWER EXTREMITY ROM: Hips: WFL, knees: WFL Ankles: mild/mod limitation for DF bilaterally.   LOWER EXTREMITY MMT:  MMT Left eval Right  eval  Hip flexion 4 4  Hip extension    Hip abduction 4 4  Hip adduction    Hip internal rotation    Hip external rotation    Knee flexion 5 5  Knee extension 5 5  Ankle dorsiflexion 4 4  Ankle plantarflexion    Ankle inversion 4 4  Ankle eversion 4 4   (Blank rows = not tested)   LOWER EXTREMITY SPECIAL TESTS:   GAIT: mild toeing out bilaterally, due to stiffness with DF.    TODAY'S TREATMENT:  DATE:  Ther ex: see below for HEP  PATIENT EDUCATION:  Education details: PT POC, Exam findings, HEP Person educated:  Patient Education method: Explanation, Demonstration, Tactile cues, Verbal cues, and Handouts Education comprehension: verbalized understanding, returned demonstration, verbal cues required, tactile cues required, and needs further education   HOME EXERCISE PROGRAM: Access Code: [email or web address] URL: [email or web address] Date: 09/02/2023 Prepared by: Sedalia Muta  Exercises - Gastroc Stretch on Wall  - 2 x daily - 3 reps - 30 hold - Standing Bilateral Gastroc Stretch with Step  - 2 x daily - 2 sets - 3 reps - 30 hold - Half Kneeling Dorsiflexion Stretch at Wall  - 2 x daily - 1-2 sets - 10 reps - 3  hold   ASSESSMENT:  CLINICAL IMPRESSION: Patient presents with primary complaint of  stiffness in bilateral ankles. She does have mild decrease in DF ROM bilaterally. She has mild toeing out with gait likely due to stiffness and lack of DF. She denies any actual pain, but stiffness in bothersome and limiting when going down stairs in AM. Pt has lack of effective HEP for her diagnosis, and will benefit from education on this.  Pt with decreased ability for full functional activities. Pt will  benefit from skilled PT to improve deficits and pain and to return to PLOF.   OBJECTIVE IMPAIRMENTS: decreased activity tolerance, decreased mobility, decreased ROM, and decreased strength.   ACTIVITY LIMITATIONS: stairs and locomotion level  PARTICIPATION LIMITATIONS: community activity  PERSONAL FACTORS:  none  are also affecting patient's functional outcome.   REHAB POTENTIAL: Good  CLINICAL DECISION MAKING: Stable/uncomplicated  EVALUATION COMPLEXITY: Low   GOALS: Goals reviewed with patient? Yes  SHORT TERM GOALS: Target date: 09/09/23  Pt to be independent with initial HEP  Goal status: INITIAL    LONG TERM GOALS: Target date: 10/21/23  Pt to be independent with initial HEP  Goal status: INITIAL  2.  Pt to report decrease in symptoms of ankle stiffness by at least 50 %, to improve mobility in the morning.   Goal status: INITIAL  3.  Pt to demo improved ankle mobility bilaterally, to be [facility] for pt age and activity level.   Goal status: INITIAL  4. Pt to demo strength and stability of bil ankles to be [facility] for descending stairs and dynamic activity.    Goal status: INITIAL      PLAN:  PT FREQUENCY: 1-2x/week  PT DURATION: 8 weeks  PLANNED INTERVENTIONS: Therapeutic exercises, Therapeutic activity, Neuromuscular re-education, Patient/Family education, Self Care, Joint mobilization, Joint manipulation, Stair training, Orthotic/Fit training, DME instructions, Aquatic  Therapy, Dry Needling, Electrical stimulation, Cryotherapy, Moist heat, Taping, Ultrasound, Ionotophoresis 4mg /ml Dexamethasone, Manual therapy,  Vasopneumatic device, Traction, Spinal manipulation, Spinal mobilization,Balance training, Gait training,   PLAN FOR NEXT SESSION:   Sedalia Muta, PT, DPT 10:32 AM  09/02/23

## 2023-08-27 NOTE — Telephone Encounter (Signed)
Chart updated

## 2023-08-28 ENCOUNTER — Encounter (INDEPENDENT_AMBULATORY_CARE_PROVIDER_SITE_OTHER): Payer: Self-pay | Admitting: Otolaryngology

## 2023-09-02 ENCOUNTER — Encounter: Payer: Self-pay | Admitting: Physical Therapy

## 2023-09-04 ENCOUNTER — Ambulatory Visit: Payer: Self-pay | Admitting: Physician Assistant

## 2023-09-10 ENCOUNTER — Ambulatory Visit (INDEPENDENT_AMBULATORY_CARE_PROVIDER_SITE_OTHER): Payer: BC Managed Care – PPO | Admitting: Physical Therapy

## 2023-09-10 ENCOUNTER — Encounter: Payer: Self-pay | Admitting: Physical Therapy

## 2023-09-10 DIAGNOSIS — M25571 Pain in right ankle and joints of right foot: Secondary | ICD-10-CM

## 2023-09-10 DIAGNOSIS — M25572 Pain in left ankle and joints of left foot: Secondary | ICD-10-CM | POA: Diagnosis not present

## 2023-09-10 NOTE — Therapy (Signed)
OUTPATIENT PHYSICAL THERAPY LOWER EXTREMITY TREATMENT   Patient Name: Ruth Hogan MRN: [id number] DOB:1990/11/29, 32 y.o., female Today's Date: 09/10/2023  END OF SESSION:  PT End of Session - 09/10/23 1348     Visit Number 2    Number of Visits 12    Date for PT Re-Evaluation 10/21/23    Authorization Type BCBS    PT Start Time 1348    PT Stop Time 1428    PT Time Calculation (min) 40 min    Activity Tolerance Patient tolerated treatment well    Behavior During Therapy WFL for tasks assessed/performed             Past Medical History:  Diagnosis Date   Allergy    Seasonal - would like to investigate more   Anxiety    Situational - in therapy since 2016   Depression    Situational - in therapy since 2016   Past Surgical History:  Procedure Laterality Date   DENTAL SURGERY     Patient Active Problem List   Diagnosis Date Noted   Insulin resistance 08/22/2023   Vitamin D deficiency 08/22/2023   Anxiety and depression 08/21/2023    PCP: Jarold Motto  REFERRING PROVIDER: Jarold Motto   REFERRING DIAG: Bil ankle stiffness   THERAPY DIAG:  Pain in left ankle and joints of left foot  Pain in right ankle and joints of right foot  Rationale for Evaluation and Treatment: Rehabilitation  ONSET DATE: 3 mo ago   SUBJECTIVE:   SUBJECTIVE STATEMENT: Pt states L ankle feeling good.  R ankle still feels stiff, and bottom of R foot is feeling sore, has not been sore before. Has been doing HEP.    Pt states newer and worsening symptom of stiffness feeling in Both ankles. States stiffness feeling when she first wakes up, difficulty for initial mobility and going down stairs in the morning. She denies any actual pain or any recent injury. She is active, likes Running regularly , sprints, and hiking., some weights . She has a Health and safety inspector job.  PERTINENT HISTORY: none  PAIN:  Are you having pain? Yes: NPRS scale: 0/10 Pain location: bil ankles  Pain  description: stiffness Aggravating factors: first thing in am, going down stairs  Relieving factors: none stated    PRECAUTIONS: None  WEIGHT BEARING RESTRICTIONS: No  FALLS:  Has patient fallen in last 6 months? No   PLOF: Independent  PATIENT GOALS:  Decreased pain   NEXT MD VISIT:   OBJECTIVE:   DIAGNOSTIC FINDINGS:   PATIENT SURVEYS:   COGNITION: Overall cognitive status: Within functional limits for tasks assessed     SENSATION: WFL  EDEMA:   POSTURE:    No Significant postural limitations, standing: foot posture: neutral foot/arch.   PALPATION: mild hypomobility of bil ankle post glide  LOWER EXTREMITY ROM: Hips: WFL, knees: WFL Ankles: mild/mod limitation for DF bilaterally.   LOWER EXTREMITY MMT:  MMT Left eval Right  eval  Hip flexion 4 4  Hip extension    Hip abduction 4 4  Hip adduction    Hip internal rotation    Hip external rotation    Knee flexion 5 5  Knee extension 5 5  Ankle dorsiflexion 4 4  Ankle plantarflexion    Ankle inversion 4 4  Ankle eversion 4 4   (Blank rows = not tested)   LOWER EXTREMITY SPECIAL TESTS:   GAIT: mild toeing out bilaterally, due to stiffness with DF.  TODAY'S TREATMENT:                                                                                                                              DATE:   09/10/2023 Therapeutic Exercise: Aerobic: Supine: Seated: education on self Pl fascia stretch x 10 for HEP Standing:  Heel raises 2 x 10;  SLS 30 sec x 2 bil;  SLS with rotation 2 x 10 bil; Walk/march 4 x 10; Step downs 4 and 6 in x 10 ea bil;  Stretches:  kneeling DF glides x 20 bil;  Standing gastroc stretch at wall 30 sec x 4 bil;  Neuromuscular Re-education: Manual Therapy: Ankle mobs to increase DF bilaterally, DTM to R pl fascia Therapeutic Activity: Self Care:   PATIENT EDUCATION:  Education details: updated and reviewed HEP Person educated:  Patient Education method: Explanation,  Demonstration, Tactile cues, Verbal cues, and Handouts Education comprehension: verbalized understanding, returned demonstration, verbal cues required, tactile cues required, and needs further education   HOME EXERCISE PROGRAM: Access Code: [email or web address] URL: [email or web address] Date: 09/02/2023 Prepared by: Sedalia Muta  Exercises - Gastroc Stretch on Wall  - 2 x daily - 3 reps - 30 hold - Standing Bilateral Gastroc Stretch with Step  - 2 x daily - 2 sets - 3 reps - 30 hold - Half Kneeling Dorsiflexion Stretch at Wall  - 2 x daily - 1-2 sets - 10 reps - 3 hold   ASSESSMENT:  CLINICAL IMPRESSION: Pt with good tolerance for exercises today. Manual done for ankle mobility and tissue release for pl fascia. Updated HEP to include this. Pt with noted instability in R >L ankle and hip. Plan to continue mobility and stability as tolerated.   Eval: Patient presents with primary complaint of  stiffness in bilateral ankles. She does have mild decrease in DF ROM bilaterally. She has mild toeing out with gait likely due to stiffness and lack of DF. She denies any actual pain, but stiffness in bothersome and limiting when going down stairs in AM. Pt has lack of effective HEP for her diagnosis, and will benefit from education on this.  Pt with decreased ability for full functional activities. Pt will  benefit from skilled PT to improve deficits and pain and to return to PLOF.   OBJECTIVE IMPAIRMENTS: decreased activity tolerance, decreased mobility, decreased ROM, and decreased strength.   ACTIVITY LIMITATIONS: stairs and locomotion level  PARTICIPATION LIMITATIONS: community activity  PERSONAL FACTORS:  none  are also affecting patient's functional outcome.   REHAB POTENTIAL: Good  CLINICAL DECISION MAKING: Stable/uncomplicated  EVALUATION COMPLEXITY: Low   GOALS: Goals reviewed with patient? Yes  SHORT TERM GOALS: Target date: 09/09/23  Pt to be independent with initial  HEP  Goal status: INITIAL    LONG TERM GOALS: Target date: 10/21/23  Pt to be independent with initial HEP  Goal status: INITIAL  2.  Pt to report decrease in symptoms of ankle stiffness by at  least 50 %, to improve mobility in the morning.   Goal status: INITIAL  3.  Pt to demo improved ankle mobility bilaterally, to be [facility] for pt age and activity level.   Goal status: INITIAL  4. Pt to demo strength and stability of bil ankles to be [facility] for descending stairs and dynamic activity.    Goal status: INITIAL      PLAN:  PT FREQUENCY: 1-2x/week  PT DURATION: 8 weeks  PLANNED INTERVENTIONS: Therapeutic exercises, Therapeutic activity, Neuromuscular re-education, Patient/Family education, Self Care, Joint mobilization, Joint manipulation, Stair training, Orthotic/Fit training, DME instructions, Aquatic Therapy, Dry Needling, Electrical stimulation, Cryotherapy, Moist heat, Taping, Ultrasound, Ionotophoresis 4mg /ml Dexamethasone, Manual therapy,  Vasopneumatic device, Traction, Spinal manipulation, Spinal mobilization,Balance training, Gait training,   PLAN FOR NEXT SESSION:   Sedalia Muta, PT, DPT 2:47 PM  09/10/23

## 2023-09-16 ENCOUNTER — Ambulatory Visit (INDEPENDENT_AMBULATORY_CARE_PROVIDER_SITE_OTHER): Payer: BC Managed Care – PPO | Admitting: Physical Therapy

## 2023-09-16 DIAGNOSIS — M25572 Pain in left ankle and joints of left foot: Secondary | ICD-10-CM | POA: Diagnosis not present

## 2023-09-16 DIAGNOSIS — M25571 Pain in right ankle and joints of right foot: Secondary | ICD-10-CM | POA: Diagnosis not present

## 2023-09-16 NOTE — Therapy (Unsigned)
OUTPATIENT PHYSICAL THERAPY LOWER EXTREMITY TREATMENT   Patient Name: Ruth Hogan MRN: [id number] DOB:23-Jun-1991, 32 y.o., female Today's Date: 09/10/2023  END OF SESSION:    Past Medical History:  Diagnosis Date   Allergy    Seasonal - would like to investigate more   Anxiety    Situational - in therapy since 2016   Depression    Situational - in therapy since 2016   Past Surgical History:  Procedure Laterality Date   DENTAL SURGERY     Patient Active Problem List   Diagnosis Date Noted   Insulin resistance 08/22/2023   Vitamin D deficiency 08/22/2023   Anxiety and depression 08/21/2023    PCP: Jarold Motto  REFERRING PROVIDER: Jarold Motto   REFERRING DIAG: Bil ankle stiffness   THERAPY DIAG:  Pain in left ankle and joints of left foot  Pain in right ankle and joints of right foot  Rationale for Evaluation and Treatment: Rehabilitation  ONSET DATE: 3 mo ago   SUBJECTIVE:   SUBJECTIVE STATEMENT: Ankles feeling still stiff , but manageable     Pt states newer and worsening symptom of stiffness feeling in Both ankles. States stiffness feeling when she first wakes up, difficulty for initial mobility and going down stairs in the morning. She denies any actual pain or any recent injury. She is active, likes Running regularly , sprints, and hiking., some weights . She has a Health and safety inspector job.  PERTINENT HISTORY: none  PAIN:  Are you having pain? Yes: NPRS scale: 0/10 Pain location: bil ankles  Pain description: stiffness Aggravating factors: first thing in am, going down stairs  Relieving factors: none stated    PRECAUTIONS: None  WEIGHT BEARING RESTRICTIONS: No  FALLS:  Has patient fallen in last 6 months? No   PLOF: Independent  PATIENT GOALS:  Decreased pain   NEXT MD VISIT:   OBJECTIVE:   DIAGNOSTIC FINDINGS:   PATIENT SURVEYS:   COGNITION: Overall cognitive status: Within functional limits for tasks  assessed     SENSATION: WFL  EDEMA:   POSTURE:    No Significant postural limitations, standing: foot posture: neutral foot/arch.   PALPATION: mild hypomobility of bil ankle post glide  LOWER EXTREMITY ROM: Hips: WFL, knees: WFL Ankles: mild/mod limitation for DF bilaterally.   LOWER EXTREMITY MMT:  MMT Left eval Right  eval  Hip flexion 4 4  Hip extension    Hip abduction 4 4  Hip adduction    Hip internal rotation    Hip external rotation    Knee flexion 5 5  Knee extension 5 5  Ankle dorsiflexion 4 4  Ankle plantarflexion    Ankle inversion 4 4  Ankle eversion 4 4   (Blank rows = not tested)   LOWER EXTREMITY SPECIAL TESTS:   GAIT: mild toeing out bilaterally, due to stiffness with DF.    TODAY'S TREATMENT:  DATE:   09/16/2023 Therapeutic Exercise: Aerobic: Supine: Seated: education on self Pl fascia stretch x 10 for HEP Standing:  Heel raises 2 x 10;  SLS 30 sec x 2 bil;  SLS with rotation 2 x 10 bil; Walk/march 4 x 10; Step downs 4 and 6 in x 10 ea bil;  Stretches:  kneeling DF glides x 20 bil;  Standing gastroc stretch at wall 30 sec x 4 bil;  Neuromuscular Re-education: Manual Therapy: Ankle mobs to increase DF bilaterally, DTM to R pl fascia Therapeutic Activity: Self Care:   PATIENT EDUCATION:  Education details: updated and reviewed HEP Person educated:  Patient Education method: Explanation, Demonstration, Tactile cues, Verbal cues, and Handouts Education comprehension: verbalized understanding, returned demonstration, verbal cues required, tactile cues required, and needs further education   HOME EXERCISE PROGRAM: Access Code: [email or web address] URL: [email or web address] Date: 09/02/2023 Prepared by: Sedalia Muta  Exercises - Gastroc Stretch on Wall  - 2 x daily - 3 reps - 30 hold - Standing Bilateral  Gastroc Stretch with Step  - 2 x daily - 2 sets - 3 reps - 30 hold - Half Kneeling Dorsiflexion Stretch at Wall  - 2 x daily - 1-2 sets - 10 reps - 3 hold   ASSESSMENT:  CLINICAL IMPRESSION: Pt with good tolerance for exercises today. Manual done for ankle mobility and tissue release for pl fascia. Updated HEP to include this. Pt with noted instability in R >L ankle and hip. Plan to continue mobility and stability as tolerated.   Eval: Patient presents with primary complaint of  stiffness in bilateral ankles. She does have mild decrease in DF ROM bilaterally. She has mild toeing out with gait likely due to stiffness and lack of DF. She denies any actual pain, but stiffness in bothersome and limiting when going down stairs in AM. Pt has lack of effective HEP for her diagnosis, and will benefit from education on this.  Pt with decreased ability for full functional activities. Pt will  benefit from skilled PT to improve deficits and pain and to return to PLOF.   OBJECTIVE IMPAIRMENTS: decreased activity tolerance, decreased mobility, decreased ROM, and decreased strength.   ACTIVITY LIMITATIONS: stairs and locomotion level  PARTICIPATION LIMITATIONS: community activity  PERSONAL FACTORS:  none  are also affecting patient's functional outcome.   REHAB POTENTIAL: Good  CLINICAL DECISION MAKING: Stable/uncomplicated  EVALUATION COMPLEXITY: Low   GOALS: Goals reviewed with patient? Yes  SHORT TERM GOALS: Target date: 09/09/23  Pt to be independent with initial HEP  Goal status: INITIAL    LONG TERM GOALS: Target date: 10/21/23  Pt to be independent with initial HEP  Goal status: INITIAL  2.  Pt to report decrease in symptoms of ankle stiffness by at least 50 %, to improve mobility in the morning.   Goal status: INITIAL  3.  Pt to demo improved ankle mobility bilaterally, to be [facility] for pt age and activity level.   Goal status: INITIAL  4. Pt to demo strength and stability of  bil ankles to be [facility] for descending stairs and dynamic activity.    Goal status: INITIAL      PLAN:  PT FREQUENCY: 1-2x/week  PT DURATION: 8 weeks  PLANNED INTERVENTIONS: Therapeutic exercises, Therapeutic activity, Neuromuscular re-education, Patient/Family education, Self Care, Joint mobilization, Joint manipulation, Stair training, Orthotic/Fit training, DME instructions, Aquatic Therapy, Dry Needling, Electrical stimulation, Cryotherapy, Moist heat, Taping, Ultrasound, Ionotophoresis 4mg /ml Dexamethasone, Manual therapy,  Vasopneumatic device, Traction, Spinal manipulation, Spinal mobilization,Balance training,  Gait training,   PLAN FOR NEXT SESSION:   Sedalia Muta, PT, DPT 4:05 PM  09/16/23

## 2023-09-17 ENCOUNTER — Encounter: Payer: Self-pay | Admitting: Physical Therapy

## 2023-09-23 ENCOUNTER — Telehealth (INDEPENDENT_AMBULATORY_CARE_PROVIDER_SITE_OTHER): Payer: Self-pay | Admitting: Otolaryngology

## 2023-09-23 ENCOUNTER — Ambulatory Visit (INDEPENDENT_AMBULATORY_CARE_PROVIDER_SITE_OTHER): Payer: BC Managed Care – PPO | Admitting: Neurology

## 2023-09-23 ENCOUNTER — Encounter: Payer: Self-pay | Admitting: Neurology

## 2023-09-23 VITALS — BP 109/67 | HR 64 | Ht 66.0 in | Wt 216.0 lb

## 2023-09-23 DIAGNOSIS — E66811 Obesity, class 1: Secondary | ICD-10-CM

## 2023-09-23 DIAGNOSIS — Z9189 Other specified personal risk factors, not elsewhere classified: Secondary | ICD-10-CM

## 2023-09-23 DIAGNOSIS — R351 Nocturia: Secondary | ICD-10-CM | POA: Diagnosis not present

## 2023-09-23 DIAGNOSIS — R519 Headache, unspecified: Secondary | ICD-10-CM

## 2023-09-23 DIAGNOSIS — Z820 Family history of epilepsy and other diseases of the nervous system: Secondary | ICD-10-CM

## 2023-09-23 DIAGNOSIS — G478 Other sleep disorders: Secondary | ICD-10-CM

## 2023-09-23 DIAGNOSIS — G4719 Other hypersomnia: Secondary | ICD-10-CM

## 2023-09-23 NOTE — Telephone Encounter (Signed)
Spoke w/patient, confirmed location and appointment

## 2023-09-23 NOTE — Progress Notes (Signed)
Subjective:    Patient ID: Ruth Hogan is a 32 y.o. female.  HPI    Ruth Foley, MD, PhD [facility] [address] [address] [place], [place] [address]  Dear Ruth Hogan,    I saw your patient, Ruth Hogan, upon your kind request in my sleep clinic today for initial consultation of her sleep disorder, in particular, concern underlying obstructive sleep apnea.  The patient is unaccompanied today.  As you know, Ruth Hogan is a 32 year old female with an underlying medical history of allergies, depression, anxiety, vitamin D deficiency and obesity, who reports chronic difficulty sleeping for years.  Her Epworth sleepiness score is 11 out of 24, fatigue severity score is 32 out of 63.  I reviewed your office note from 08/21/2023.  She does not wake up rested.  She is tired during the day.  She is not sure if she snores.  She is single and lives alone.  She does have a family history of snoring including her brother and both her father and paternal grandfather have sleep apnea, using PAP machines.  Her bedtime is between 10 and 11 and rise time around 5.  She works in Environmental manager.  She works mostly in the office, sometimes from home.  She has a dog in the household, the dog does not typically sleep on the bed with her.  She does not have a TV in her bedroom.  Weight has been fluctuating.  She has had slow weight gain between 2020 and 2023.  She has tried melatonin in the past, she does not currently take any medication to help her sleep at night.  She drinks caffeine in the form of coffee, 3 to 5 cups/day, no daily soda.  She is a non-smoker and drinks no alcohol currently.  She has had occasional morning headaches.  She has nocturia about once per average night.  She is supposed to see an ENT soon, she has a history of tonsillar stones.  Her Past Medical History Is Significant For: Past Medical History:  Diagnosis Date   Allergy     Seasonal - would like to investigate more   Anxiety    Situational - in therapy since 2016   Depression    Situational - in therapy since 2016    Her Past Surgical History Is Significant For: Past Surgical History:  Procedure Laterality Date   DENTAL SURGERY      Her Family History Is Significant For: Family History  Problem Relation Age of Onset   Sleep apnea Father    Rheum arthritis Maternal Grandmother    Colon cancer Maternal Grandfather 82   Diabetes Paternal Grandmother    Sleep apnea Paternal Grandfather    Miscarriages / Stillbirths Maternal Aunt     Her Social History Is Significant For: Social History   Socioeconomic History   Marital status: Single    Spouse name: Not on file   Number of children: Not on file   Years of education: Not on file   Highest education level: Not on file  Occupational History   Not on file  Tobacco Use   Smoking status: Never   Smokeless tobacco: Never  Vaping Use   Vaping status: Never Used  Substance and Sexual Activity   Alcohol use: Yes    Alcohol/week: 2.0 - 3.0 standard drinks of alcohol    Types: 1 Cans of beer, 1 - 2 Standard drinks or equivalent per week  Comment: typically social   Drug use: Never   Sexual activity: Not Currently    Birth control/protection: Abstinence, Condom  Other Topics Concern   Not on file  Social History Narrative   Work in Administrator, 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   Vitamin D, Ergocalciferol, (DRISDOL) 1.25 MG (50000 UNIT) CAPS capsule Take 1 capsule (50,000 Units total) by mouth every 7 (seven) days.   No facility-administered encounter medications on file as of 09/23/2023.  :   Review of Systems:  Out of a complete 14 point review of systems, all are reviewed and negative with the exception of these symptoms as listed below:  Review of Systems  Neurological:        Patient is here alone for sleep consult. She states for the last 15-20 years she had had nights where she doesn't sleep. It does not seem to be related to anything but over the last 1-2 years it has gotten worse. Her dad and paternal grandfather both have sleep apnea. She would like to be evaluated just in case she has it too. She has never had a sleep study. ESS 11 FSS 32    Objective:  Neurological Exam  Physical Exam Physical Examination:   Vitals:   09/23/23 1412  BP: 109/67  Pulse: 64    General Examination: The patient is a very pleasant 32 y.o. female in no acute distress. She appears well-developed and well-nourished and well groomed.   HEENT: Normocephalic, atraumatic, pupils are equal, round and reactive to light, extraocular tracking is good without limitation to gaze excursion or nystagmus noted. Hearing is grossly intact. Face is symmetric with normal facial animation. Speech is clear with no dysarthria noted. There is no hypophonia. There is no lip, neck/head, jaw or voice tremor. Neck is supple with full range of passive and active motion. There are no carotid bruits on auscultation. Oropharynx exam reveals: No significant mouth dryness, good dental hygiene, mild airway crowding secondary to small airway entry.  Tonsillar size of 1-2+ on the right and 2+ on the left, no obvious tonsillar stones, Mallampati class II, mild overbite noted.  Tongue protrudes centrally and palate elevates symmetrically, neck  circumference 14-1/2 inches.   Chest: Clear to auscultation without wheezing, rhonchi or crackles noted.  Heart: S1+S2+0, regular and normal without murmurs, rubs or gallops noted.   Abdomen: Soft, non-tender and non-distended.  Extremities: There is no pitting edema in the distal lower extremities bilaterally.   Skin: Warm and dry without trophic changes noted.   Musculoskeletal: exam reveals no obvious joint deformities.   Neurologically:  Mental status: The patient is awake, alert and oriented in all 4 spheres. Her immediate and remote memory, attention, language skills and fund of knowledge are appropriate. There is no evidence of aphasia, agnosia, apraxia or anomia. Speech is clear with  normal prosody and enunciation. Thought process is linear. Mood is normal and affect is normal.  Cranial nerves II - XII are as described above under HEENT exam.  Motor exam: Normal bulk, strength and tone is noted. There is no obvious action or resting tremor.  Fine motor skills and coordination: grossly intact.  Cerebellar testing: No dysmetria or intention tremor. There is no truncal or gait ataxia.  Sensory exam: intact to light touch in the upper and lower extremities.  Gait, station and balance: She stands easily. No veering to one side is noted. No leaning to one side is noted. Posture is age-appropriate and stance is narrow based. Gait shows normal stride length and normal pace. No problems turning are noted.   Assessment and Plan:  In summary, Ruth Hogan is a very pleasant 32 y.o.-year old female with an underlying medical history of allergies, depression, anxiety, vitamin D deficiency and obesity, whose history and physical exam are concerning for sleep disordered breathing, particularly obstructive sleep apnea (OSA).  A laboratory attended sleep study is typically considered "gold standard" for evaluation of sleep disordered breathing.   I had a long chat with the patient about my  findings and the diagnosis of sleep apnea, particularly OSA, its prognosis and treatment options. We talked about medical/conservative treatments, surgical interventions and non-pharmacological approaches for symptom control. I explained, in particular, the risks and ramifications of untreated moderate to severe OSA, especially with respect to developing cardiovascular disease down the road, including congestive heart failure (CHF), difficult to treat hypertension, cardiac arrhythmias (particularly A-fib), neurovascular complications including TIA, stroke and dementia. Even type 2 diabetes has, in part, been linked to untreated OSA. Symptoms of untreated OSA may include (but may not be limited to) daytime sleepiness, nocturia (i.e. frequent nighttime urination), memory problems, mood irritability and suboptimally controlled or worsening mood disorder such as depression and/or anxiety, lack of energy, lack of motivation, physical discomfort, as well as recurrent headaches, especially morning or nocturnal headaches. We talked about the importance of maintaining a healthy lifestyle and striving for healthy weight.  In addition, we talked about the importance of striving for and maintaining good sleep hygiene. I recommended a sleep study at this time. I outlined the differences between a laboratory attended sleep study which is considered more comprehensive and accurate over the option of a home sleep test (HST); the latter may lead to underestimation of sleep disordered breathing in some instances and does not help with diagnosing upper airway resistance syndrome and is not accurate enough to diagnose primary central sleep apnea typically. I outlined possible surgical and non-surgical treatment options of OSA, including the use of a positive airway pressure (PAP) device (i.e. CPAP, AutoPAP/APAP or BiPAP in certain circumstances), a custom-made dental device (aka oral appliance, which would require a referral to a  specialist dentist or orthodontist typically, and is generally speaking not considered for patients with full dentures or edentulous state), upper airway surgical options, such as traditional UPPP (which is not considered a first-line treatment) or the Inspire device (hypoglossal nerve stimulator, which would involve a referral for consultation with an ENT surgeon, after careful selection, following inclusion criteria - also not first-line treatment). I explained the PAP treatment option to the patient in detail, as this is generally considered first-line treatment.  The patient indicated that she would be willing to try PAP therapy, if the need arises. I explained the importance of being compliant with PAP treatment, not only for insurance purposes but primarily to improve  patient's symptoms symptoms, and for the patient's long term health benefit, including to reduce Her cardiovascular risks longer-term.    We will pick up our discussion about the next steps and treatment options after testing.  We will keep her posted as to the test results by phone call and/or MyChart messaging where possible.  We will plan to follow-up in sleep clinic accordingly as well.  I answered all her questions today and the patient was in agreement.   I encouraged her to call with any interim questions, concerns, problems or updates or email [place] through MyChart.  Generally speaking, sleep test authorizations may take up to 2 weeks, sometimes less, sometimes longer, the patient is encouraged to get in touch with [place] if they do not hear back from the sleep lab staff directly within the next 2 weeks.  Thank you very much for allowing me to participate in the care of this nice patient. If I can be of any further assistance to you please do not hesitate to call me at [phone].  Sincerely,   Ruth Foley, MD, PhD

## 2023-09-23 NOTE — Patient Instructions (Signed)

## 2023-09-24 ENCOUNTER — Ambulatory Visit (INDEPENDENT_AMBULATORY_CARE_PROVIDER_SITE_OTHER): Payer: BC Managed Care – PPO | Admitting: Otolaryngology

## 2023-09-24 ENCOUNTER — Encounter (INDEPENDENT_AMBULATORY_CARE_PROVIDER_SITE_OTHER): Payer: Self-pay

## 2023-09-24 VITALS — Ht 66.0 in | Wt 216.0 lb

## 2023-09-24 DIAGNOSIS — J358 Other chronic diseases of tonsils and adenoids: Secondary | ICD-10-CM | POA: Diagnosis not present

## 2023-09-24 DIAGNOSIS — J3501 Chronic tonsillitis: Secondary | ICD-10-CM

## 2023-09-24 NOTE — Progress Notes (Signed)
Dear Dr. Bufford Buttner, Here is my assessment for our mutual patient, Ruth Hogan. Thank you for allowing me the opportunity to care for your patient. Please do not hesitate to contact me should you have any other questions. Sincerely, Dr. Jovita Kussmaul  Otolaryngology Clinic Note Referring provider: Dr. Bufford Buttner HPI:  Ruth Hogan is a 32 y.o. female kindly referred by Dr. Bufford Buttner for evaluation of tonsilloliths. She reports that she has had intermittent episodes of throat pain and discomfort over the past 5 years. About once a month over summer months - 7-8 per year. Has been noticing them with increasing frequency. Happens around allergy season. Can be painful. Never gets antibiotics. Brush them out to get rid of them. No snoring, sleeps well. When sick, uses salt water gargles. No dysphagia, hemoptysis, no ear pain, neck masses.  Uses Allegra during allergy season, and flonase. Does not get frequent sinus infections  H&N Surgery: no Personal or FHx of bleeding dz or anesthesia difficulty: no   GLP-1: no AP/AC: no  Tobacco: no. 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  PMHx: Anxiety/Depression  Independent Review of Additional Tests or Records:  Primary care notes reviewed and summarized  PMH/Meds/All/SocHx/FamHx/ROS:   Past Medical History:  Diagnosis Date   Allergy    Seasonal - would like to investigate more   Anxiety    Situational - in therapy since 2016   Depression    Situational - in therapy since 2016     Past Surgical History:  Procedure Laterality Date   DENTAL SURGERY      Family History  Problem Relation Age of Onset   Sleep apnea Father    Rheum arthritis Maternal Grandmother    Colon cancer Maternal Grandfather 70   Diabetes Paternal Grandmother    Sleep apnea Paternal Grandfather    Miscarriages / Stillbirths Maternal Aunt      Social Connections: Not on file      Current Outpatient Medications:    fexofenadine (ALLEGRA)  180 MG tablet, Take 180 mg by mouth daily., Disp: , Rfl:    Vitamin D, Ergocalciferol, (DRISDOL) 1.25 MG (50000 UNIT) CAPS capsule, Take 1 capsule (50,000 Units total) by mouth every 7 (seven) days., Disp: 12 capsule, Rfl: 0   Physical Exam:   Ht 5\' 6"  (1.676 m)   Wt 216 lb (98 kg)   BMI 34.86 kg/m   Salient findings:  CN II-XII intact  Bilateral EAC clear and TM intact with well pneumatized middle ear spaces Anterior rhinoscopy: Septum relatively midline; some crusting left vestibule; bilateral inferior turbinates without significant hypertrophy No lesions of oral cavity/oropharynx; dentition good; tonsils 1+, small stone left mid-pole, normal in appearance, not appear to be significantly symmetric No obviously palpable neck masses/lymphadenopathy/thyromegaly No respiratory distress or stridor  Seprately Identifiable Procedures:  None  Impression & Plans:  Ruth Hogan is a 32 y.o. female with:  1. Chronic tonsillitis   2. Tonsillolith    We discussed management for this, including observation, antibiotics, and tonsillectomy. She is having a fair amount of painful episodes, so a tonsillectomy would be reasonable but certainly an option.  We discussed R/B/A for Tonsillectomy including significant post-op pain, bleeding (3%, including life threatening bleeding and requiring return to OR), and infections (still with pharyngitis) as well as persistent symptoms and risk of anesthesia. We also discussed post-op management and risks.  Patient would like to think about it, which is reasonable. In the interim, recommend good oral care with salt water gargles, brushing teeth and tongue  2x/day, and can consider using water pik for tonsils.  - f/u PRN   Thank you for allowing me the opportunity to care for your patient. Please do not hesitate to contact me should you have any other questions.  Sincerely, Jovita Kussmaul, MD Otolarynoglogist (ENT), [facility] Phone:  [phone] Fax: [phone]  09/24/2023, 8:17 AM   MDM:  Level 3 Complexity/Problems addressed: low - chronic problem Data complexity: low - independent review of notes and tests - Morbidity: low - decision for surgery - Prescription Drug prescribed or managed: no

## 2023-09-26 ENCOUNTER — Ambulatory Visit: Payer: Self-pay | Admitting: Physician Assistant

## 2023-10-07 ENCOUNTER — Encounter: Payer: BC Managed Care – PPO | Admitting: Physical Therapy

## 2023-10-14 ENCOUNTER — Telehealth: Payer: Self-pay | Admitting: Neurology

## 2023-10-14 ENCOUNTER — Encounter: Payer: BC Managed Care – PPO | Admitting: Physical Therapy

## 2023-10-14 NOTE — Telephone Encounter (Signed)
NPSG [employer] pending faxed notes.

## 2023-10-21 ENCOUNTER — Encounter: Payer: BC Managed Care – PPO | Admitting: Obstetrics and Gynecology

## 2023-10-21 NOTE — Telephone Encounter (Signed)
[employer] denied the NPSG.  [employer] HST pending faxed notes.

## 2023-10-22 NOTE — Telephone Encounter (Signed)
Below for denial for the NPSG

## 2023-10-24 ENCOUNTER — Ambulatory Visit (INDEPENDENT_AMBULATORY_CARE_PROVIDER_SITE_OTHER): Payer: BC Managed Care – PPO | Admitting: Physical Therapy

## 2023-10-24 DIAGNOSIS — M25571 Pain in right ankle and joints of right foot: Secondary | ICD-10-CM | POA: Diagnosis not present

## 2023-10-24 DIAGNOSIS — M25572 Pain in left ankle and joints of left foot: Secondary | ICD-10-CM | POA: Diagnosis not present

## 2023-10-24 NOTE — Therapy (Addendum)
 OUTPATIENT PHYSICAL THERAPY LOWER EXTREMITY TREATMENT   Patient Name: Ruth Hogan MRN: [id number] DOB:1991-09-19, 32 y.o., female Today's Date: 12/19//2024  END OF SESSION:  PT End of Session - 10/27/23 1943     Visit Number 4    Number of Visits 12    Date for PT Re-Evaluation 10/21/23    Authorization Type BCBS    PT Start Time 1523    PT Stop Time 1601    PT Time Calculation (min) 38 min    Activity Tolerance Patient tolerated treatment well    Behavior During Therapy WFL for tasks assessed/performed               Past Medical History:  Diagnosis Date   Allergy    Seasonal - would like to investigate more   Anxiety    Situational - in therapy since 2016   Depression    Situational - in therapy since 2016   Past Surgical History:  Procedure Laterality Date   DENTAL SURGERY     Patient Active Problem List   Diagnosis Date Noted   Insulin resistance 08/22/2023   Vitamin D  deficiency 08/22/2023   Anxiety and depression 08/21/2023    PCP: Alexander Iba  REFERRING PROVIDER: Alexander Iba   REFERRING DIAG: Bil ankle stiffness   THERAPY DIAG:  Pain in left ankle and joints of left foot  Pain in right ankle and joints of right foot  Rationale for Evaluation and Treatment: Rehabilitation  ONSET DATE: 3 mo ago   SUBJECTIVE:   SUBJECTIVE STATEMENT: Ankle is feeling better, looser, R foot hurting. Hurts first thing in the morning when going down the steps. Not any other time of day. Is running: no pain.  No other pain.   Pt states newer and worsening symptom of stiffness feeling in Both ankles. States stiffness feeling when she first wakes up, difficulty for initial mobility and going down stairs in the morning. She denies any actual pain or any recent injury. She is active, likes Running regularly , sprints, and hiking., some weights . She has a Health and safety inspector job.  PERTINENT HISTORY: none  PAIN:  Are you having pain? Yes: NPRS scale:  0-3/10 Pain location: bil ankles  Pain description: stiffness Aggravating factors: first thing in am, going down stairs  Relieving factors: none stated    PRECAUTIONS: None  WEIGHT BEARING RESTRICTIONS: No  FALLS:  Has patient fallen in last 6 months? No   PLOF: Independent  PATIENT GOALS:  Decreased pain   NEXT MD VISIT:   OBJECTIVE:   DIAGNOSTIC FINDINGS:   PATIENT SURVEYS:   COGNITION: Overall cognitive status: Within functional limits for tasks assessed     SENSATION: WFL  EDEMA:   POSTURE:    No Significant postural limitations, standing: foot posture: neutral foot/arch.   PALPATION: mild hypomobility of bil ankle post glide  LOWER EXTREMITY ROM: Hips: WFL, knees: WFL Ankles: mild/mod limitation for DF bilaterally.   LOWER EXTREMITY MMT:  MMT Left eval Right  eval  Hip flexion 4 4  Hip extension    Hip abduction 4 4  Hip adduction    Hip internal rotation    Hip external rotation    Knee flexion 5 5  Knee extension 5 5  Ankle dorsiflexion 4 4  Ankle plantarflexion    Ankle inversion 4 4  Ankle eversion 4 4   (Blank rows = not tested)   LOWER EXTREMITY SPECIAL TESTS:   GAIT: mild toeing out bilaterally, due to stiffness  with DF.    TODAY'S TREATMENT:                                                                                                                              DATE:   10/24/2023 Therapeutic Exercise: Aerobic: Supine: Seated:  Standing:  Heel raises 2 x 10;  SLS 30 sec x 2 bil;  SLS with rotation 2 x 10 bil; squats 3x 10 with education on form and ankle/foot positioning.  Stretches:  kneeling DF glides x 20 bil;    Neuromuscular Re-education: Manual Therapy: Ankle mobs to increase DF bilaterally, DTM/TFM to R pl fascia   Therapeutic Activity: Self Care:   PATIENT EDUCATION:  Education details: updated and reviewed HEP Person educated:  Patient Education method: Explanation, Demonstration, Tactile cues, Verbal cues,  and Handouts Education comprehension: verbalized understanding, returned demonstration, verbal cues required, tactile cues required, and needs further education   HOME EXERCISE PROGRAM: Access Code: [email or web address] URL: [email or web address] Date: 09/02/2023 Prepared by: Terrilee Few  Exercises - Gastroc Stretch on Wall  - 2 x daily - 3 reps - 30 hold - Standing Bilateral Gastroc Stretch with Step  - 2 x daily - 2 sets - 3 reps - 30 hold - Half Kneeling Dorsiflexion Stretch at Wall  - 2 x daily - 1-2 sets - 10 reps - 3 hold   ASSESSMENT:  CLINICAL IMPRESSION: Pt with slight improvement of ROM with glides today. She will benefit from continued mobility for ankle ROM, as well as strengthening. Discussed possible night splint if pain does not improve. Pt having low level pain only in AM, improving as day goes on and with higher level activity. Updated HEP today to include more ROM in AM when painful.   Eval: Patient presents with primary complaint of  stiffness in bilateral ankles. She does have mild decrease in DF ROM bilaterally. She has mild toeing out with gait likely due to stiffness and lack of DF. She denies any actual pain, but stiffness in bothersome and limiting when going down stairs in AM. Pt has lack of effective HEP for her diagnosis, and will benefit from education on this.  Pt with decreased ability for full functional activities. Pt will  benefit from skilled PT to improve deficits and pain and to return to PLOF.   OBJECTIVE IMPAIRMENTS: decreased activity tolerance, decreased mobility, decreased ROM, and decreased strength.   ACTIVITY LIMITATIONS: stairs and locomotion level  PARTICIPATION LIMITATIONS: community activity  PERSONAL FACTORS: none are also affecting patient's functional outcome.   REHAB POTENTIAL: Good  CLINICAL DECISION MAKING: Stable/uncomplicated  EVALUATION COMPLEXITY: Low   GOALS: Goals reviewed with patient? Yes  SHORT TERM GOALS:  Target date: 09/09/23  Pt to be independent with initial HEP  Goal status: INITIAL    LONG TERM GOALS: Target date: 10/21/23  Pt to be independent with initial HEP  Goal status: INITIAL  2.  Pt to report decrease in symptoms of ankle stiffness  by at least 50 %, to improve mobility in the morning.   Goal status: INITIAL  3.  Pt to demo improved ankle mobility bilaterally, to be [facility] for pt age and activity level.   Goal status: INITIAL  4. Pt to demo strength and stability of bil ankles to be [facility] for descending stairs and dynamic activity.    Goal status: INITIAL      PLAN:  PT FREQUENCY: 1-2x/week  PT DURATION: 8 weeks  PLANNED INTERVENTIONS: Therapeutic exercises, Therapeutic activity, Neuromuscular re-education, Patient/Family education, Self Care, Joint mobilization, Joint manipulation, Stair training, Orthotic/Fit training, DME instructions, Aquatic Therapy, Dry Needling, Electrical stimulation, Cryotherapy, Moist heat, Taping, Ultrasound, Ionotophoresis 4mg /ml Dexamethasone, Manual therapy,  Vasopneumatic device, Traction, Spinal manipulation, Spinal mobilization,Balance training, Gait training,   PLAN FOR NEXT SESSION:   Terrilee Few, PT, DPT 7:44 PM  10/27/23   PHYSICAL THERAPY DISCHARGE SUMMARY  Visits from Start of Care: 4  Plan: Patient agrees to discharge.  Patient goals were partially met. Patient is being discharged due to - not returning since last visit.     Terrilee Few, PT, DPT 10:38 AM  04/06/24

## 2023-10-27 ENCOUNTER — Encounter: Payer: Self-pay | Admitting: Physical Therapy

## 2023-11-12 NOTE — Telephone Encounter (Signed)
 HST- BCBS no auth req via fax form

## 2023-12-24 ENCOUNTER — Encounter: Payer: BC Managed Care – PPO | Admitting: Obstetrics and Gynecology

## 2023-12-24 ENCOUNTER — Other Ambulatory Visit (HOSPITAL_COMMUNITY)
Admission: RE | Admit: 2023-12-24 | Discharge: 2023-12-24 | Disposition: A | Discharge status: Home / Self Care | Payer: BC Managed Care – PPO | Source: Ambulatory Visit | Attending: Obstetrics and Gynecology | Admitting: Obstetrics and Gynecology

## 2023-12-24 ENCOUNTER — Ambulatory Visit (INDEPENDENT_AMBULATORY_CARE_PROVIDER_SITE_OTHER): Payer: BC Managed Care – PPO | Admitting: Obstetrics and Gynecology

## 2023-12-24 ENCOUNTER — Encounter: Payer: Self-pay | Admitting: Obstetrics and Gynecology

## 2023-12-24 VITALS — BP 108/64 | HR 71 | Temp 98.0°F | Ht 66.25 in | Wt 214.0 lb

## 2023-12-24 DIAGNOSIS — Z124 Encounter for screening for malignant neoplasm of cervix: Secondary | ICD-10-CM | POA: Diagnosis present

## 2023-12-24 DIAGNOSIS — Z1331 Encounter for screening for depression: Secondary | ICD-10-CM | POA: Diagnosis not present

## 2023-12-24 DIAGNOSIS — Z01419 Encounter for gynecological examination (general) (routine) without abnormal findings: Secondary | ICD-10-CM

## 2023-12-24 HISTORY — DX: Encounter for gynecological examination (general) (routine) without abnormal findings: Z01.419

## 2023-12-24 NOTE — Assessment & Plan Note (Signed)
 Cervical cancer screening performed according to ASCCP guidelines. Labs and immunizations with her primary Encouraged safe sexual practices as indicated Encouraged healthy lifestyle practices with diet and exercise For patients under 35yo, I recommend 1000mg  calcium daily and 600IU of vitamin D daily.

## 2023-12-24 NOTE — Progress Notes (Signed)
 33 y.o. G0P0000 female here for annual exam. Single. 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  Has not had GYN care in 10 years.  No complaints for today's appointment.  Patient's last menstrual period was 12/09/2023 (approximate). Period Duration (Days): 7-10 Period Pattern: Regular Menstrual Flow: Moderate Menstrual Control: Maxi pad Dysmenorrhea: (!) Mild  Abnormal bleeding: none Pelvic discharge or pain: none Breast mass, nipple discharge or skin changes : none Birth control: abstinent Last PAP: No results found for: "DIAGPAP", "HPVHIGH", "ADEQPAP" Gardasil: unknown Sexually active: No Exercising: Yes, cardio and strength Smoker: No PHQ-9: 10, known anxiety and depression, has been in counseling for 8 years.  Some feel symptoms are well-controlled.  GYN HISTORY: No significant history  OB History  Gravida Para Term Preterm AB Living  0 0 0 0 0 0  SAB IAB Ectopic Multiple Live Births  0 0 0 0 0    Past Medical History:  Diagnosis Date   Allergy    Seasonal - would like to investigate more   Anxiety    Situational - in therapy since 2016   Depression    Situational - in therapy since 2016   Well woman exam with routine gynecological exam 12/24/2023    Past Surgical History:  Procedure Laterality Date   DENTAL SURGERY      Current Outpatient Medications on File Prior to Visit  Medication Sig Dispense Refill   fexofenadine (ALLEGRA) 180 MG tablet Take 180 mg by mouth daily.     No current facility-administered medications on file prior to visit.    Social History   Socioeconomic History   Marital status: Single    Spouse name: Not on file   Number of children: Not on file   Years of education: Not on file   Highest education level: Not on file  Occupational History   Not on file  Tobacco Use   Smoking status: Never   Smokeless tobacco: Never  Vaping Use   Vaping status: Never Used  Substance and Sexual Activity   Alcohol use: Yes     Alcohol/week: 2.0 - 3.0 standard drinks of alcohol    Types: 1 Cans of beer, 1 - 2 Standard drinks or equivalent per week    Comment: typically social   Drug use: Never   Sexual activity: Not Currently    Birth control/protection: Abstinence  Other Topics Concern   Not on file  Social History Narrative   Work in Administrator, 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   From [place]    [employer]    Right handed   Caffeine: 3-5 cups/day (typically earlier in the day)   Social Drivers of Corporate investment banker Strain: Not on file  Food Insecurity: Not on file  Transportation Needs: Not on file  Physical Activity: Not on file  Stress: Not on file  Social Connections: Not on file  Intimate Partner Violence: Not on file    Family History  Problem Relation Age of Onset   Sleep apnea Father    Rheum arthritis Maternal Grandmother    Colon cancer Maternal Grandfather 60   Diabetes Paternal Grandmother    Sleep apnea Paternal Grandfather    Miscarriages / Stillbirths Maternal Aunt     No Known Allergies    PE Today's Vitals   12/24/23 0851  BP: 108/64  Pulse: 71  Temp: 98 F (36.7 C)  TempSrc: Oral  SpO2: 99%  Weight:  214 lb (97.1 kg)  Height: 5' 6.25" (1.683 m)   Body mass index is 34.28 kg/m.  Physical Exam Vitals reviewed. Exam conducted with a chaperone present.  Constitutional:      General: She is not in acute distress.    Appearance: Normal appearance.  HENT:     Head: Normocephalic and atraumatic.     Nose: Nose normal.  Eyes:     Extraocular Movements: Extraocular movements intact.     Conjunctiva/sclera: Conjunctivae normal.  Neck:     Thyroid: No thyroid mass, thyromegaly or thyroid tenderness.  Pulmonary:     Effort: Pulmonary effort is normal.  Chest:     Chest wall: No mass or tenderness.  Breasts:    Right: Normal. No swelling, mass, nipple discharge, skin change or tenderness.     Left: Normal. No swelling,  mass, nipple discharge, skin change or tenderness.  Abdominal:     General: There is no distension.     Palpations: Abdomen is soft.     Tenderness: There is no abdominal tenderness.  Genitourinary:    General: Normal vulva.     Exam position: Lithotomy position.     Urethra: No prolapse.     Vagina: Normal. No vaginal discharge or bleeding.     Cervix: Normal. No lesion.     Uterus: Normal. Not enlarged and not tender.      Adnexa: Right adnexa normal and left adnexa normal.  Musculoskeletal:        General: Normal range of motion.     Cervical back: Normal range of motion.  Lymphadenopathy:     Upper Body:     Right upper body: No axillary adenopathy.     Left upper body: No axillary adenopathy.     Lower Body: No right inguinal adenopathy. No left inguinal adenopathy.  Skin:    General: Skin is warm and dry.  Neurological:     General: No focal deficit present.     Mental Status: She is alert.  Psychiatric:        Mood and Affect: Mood normal.        Behavior: Behavior normal.       Assessment and Plan:        Well woman exam with routine gynecological exam Assessment & Plan: Cervical cancer screening performed according to ASCCP guidelines. Labs and immunizations with her primary Encouraged safe sexual practices as indicated Encouraged healthy lifestyle practices with diet and exercise For patients under 50yo, I recommend 1000mg  calcium daily and 600IU of vitamin D daily.    Cervical cancer screening -     Cytology - PAP    Sonja Manseau Lasandra Beech, MD

## 2023-12-24 NOTE — Patient Instructions (Signed)

## 2023-12-27 ENCOUNTER — Encounter: Payer: Self-pay | Admitting: Obstetrics and Gynecology

## 2023-12-27 LAB — CYTOLOGY - PAP
Comment: NEGATIVE
Diagnosis: NEGATIVE
Diagnosis: REACTIVE
High risk HPV: NEGATIVE

## 2024-03-02 NOTE — Progress Notes (Signed)
 Ruth Hogan is a 33 y.o. female here for a {New prob or follow up:31724}.  History of Present Illness:   No chief complaint on file.  Lump on right axilla: Pt complains of a lump on her right axilla, *** starting ***.  ***           Past Medical History:  Diagnosis Date   Allergy    Seasonal - would like to investigate more   Anxiety    Situational - in therapy since 2016   Depression    Situational - in therapy since 2016   Well woman exam with routine gynecological exam 12/24/2023     Social History   Tobacco Use   Smoking status: Never   Smokeless tobacco: Never  Vaping Use   Vaping status: Never Used  Substance Use Topics   Alcohol use: Yes    Alcohol/week: 2.0 - 3.0 standard drinks of alcohol    Types: 1 Cans of beer, 1 - 2 Standard drinks or equivalent per week    Comment: typically social   Drug use: Never    Past Surgical History:  Procedure Laterality Date   DENTAL SURGERY      Family History  Problem Relation Age of Onset   Sleep apnea Father    Rheum arthritis Maternal Grandmother    Colon cancer Maternal Grandfather 67   Diabetes Paternal Grandmother    Sleep apnea Paternal Grandfather    Miscarriages / Stillbirths Maternal Aunt     No Known Allergies  Current Medications:   Current Outpatient Medications:    fexofenadine (ALLEGRA) 180 MG tablet, Take 180 mg by mouth daily., Disp: , Rfl:    Review of Systems:   Negative unless otherwise specified per HPI.  Vitals:   There were no vitals filed for this visit.   There is no height or weight on file to calculate BMI.  Physical Exam:   Physical Exam  Assessment and Plan:   There are no diagnoses linked to this encounter.   I, Bernita Bristle, acting as a Neurosurgeon for Alexander Iba, [place]., have documented all relevant documentation on the behalf of Alexander Iba, [place], as directed by  Alexander Iba, PA while in the presence of Alexander Iba, [place].  I, Bernita Bristle,  have reviewed all documentation for this visit. The documentation on 03/02/24 for the exam, diagnosis, procedures, and orders are all accurate and complete.  Alexander Iba, PA-C

## 2024-03-03 ENCOUNTER — Ambulatory Visit (INDEPENDENT_AMBULATORY_CARE_PROVIDER_SITE_OTHER): Admitting: Physician Assistant

## 2024-03-03 VITALS — BP 110/80 | HR 71 | Temp 97.7°F | Ht 66.25 in | Wt 215.5 lb

## 2024-03-03 DIAGNOSIS — R229 Localized swelling, mass and lump, unspecified: Secondary | ICD-10-CM | POA: Diagnosis not present

## 2024-03-03 NOTE — Patient Instructions (Signed)
 It was great to see you!  I suspect that you have may an epidermoid cyst  Please use warm compression a few times a day and ibuprofen  If this area does not improve, please let me know and we will get imaging of your breast and axilla  Take care,  Neria Procter PA-C

## 2024-07-07 ENCOUNTER — Encounter: Payer: Self-pay | Admitting: Physician Assistant

## 2024-07-07 ENCOUNTER — Ambulatory Visit: Admitting: Physician Assistant

## 2024-07-07 VITALS — BP 110/72 | HR 69 | Temp 97.5°F | Ht 66.25 in | Wt 220.0 lb

## 2024-07-07 DIAGNOSIS — Z23 Encounter for immunization: Secondary | ICD-10-CM

## 2024-07-07 DIAGNOSIS — Z7184 Encounter for health counseling related to travel: Secondary | ICD-10-CM | POA: Diagnosis not present

## 2024-07-07 MED ORDER — AZITHROMYCIN 500 MG PO TABS: 500.0000 mg | ORAL_TABLET | Freq: Every day | ORAL | 0 refills | Status: AC

## 2024-07-07 MED ORDER — ACETAZOLAMIDE 125 MG PO TABS: ORAL_TABLET | ORAL | 0 refills | Status: AC

## 2024-07-07 MED ORDER — TYPHOID VACCINE PO CPDR: 1.0000 | DELAYED_RELEASE_CAPSULE | ORAL | 0 refills | Status: AC

## 2024-07-07 NOTE — Patient Instructions (Signed)
 It was great to see you!  Altitude Sickness -- diamox /acetazolamide   Prevention, moderate- to high-risk situations: Note: Use in addition to gradual ascent; start the day before (preferred) or on the day of ascent. Oral (immediate release): 125 mg twice daily; may be discontinued after staying at the same elevation for 2 to 4 days or if descent is initiated.  Typhoid Fever prevention - Vivotif Primary immunization: One capsule on alternate days (day 1, 3, 5, and 7) for a total of 4 doses; all doses should be complete at least 1 week prior to potential exposure.   Diarrhea -- azithromycin  If severe diarrhea, take 500 mg daily x 3 days   Take care,  Lucie Buttner PA-C

## 2024-07-07 NOTE — Progress Notes (Signed)
 Ruth Hogan is a 33 y.o. female here for a new problem.  History of Present Illness:   Chief Complaint  Patient presents with   Vaccines for Travel    Pt is needing vaccines for traveling to [place], also would like medication for diarrhea and high altitude.    Discussed the use of AI scribe software for clinical note transcription with the patient, who gave verbal consent to proceed.  History of Present Illness Ruth Hogan is a 33 year old female who presents for travel-related medication prescriptions.  She plans to travel to high altitude areas, flying into [place] and then connecting to [place] from October 3rd to October 13th. She requests acetazolamide  for altitude sickness. She also requests azithromycin  for potential traveler's diarrhea.  She inquires about the typhoid fever vaccine, an oral vaccine taken on alternate days for four doses, to be completed one week before travel. She confirms she has not received this vaccine in the past five years. She has received her flu shot.  She reports no current prescription medications and uses over-the-counter Dramamine for nausea. She is preparing for a hiking trip and has been training by hiking seven to eight miles daily.     Past Medical History:  Diagnosis Date   Allergy    Seasonal - would like to investigate more   Anxiety    Situational - in therapy since 2016   Depression    Situational - in therapy since 2016   Well woman exam with routine gynecological exam 12/24/2023     Social History   Tobacco Use   Smoking status: Never   Smokeless tobacco: Never  Vaping Use   Vaping status: Never Used  Substance Use Topics   Alcohol use: Yes    Alcohol/week: 2.0 - 3.0 standard drinks of alcohol    Types: 1 Cans of beer, 1 - 2 Standard drinks or equivalent per week    Comment: typically social   Drug use: Never    Past Surgical History:  Procedure Laterality Date   DENTAL SURGERY      Family History   Problem Relation Age of Onset   Sleep apnea Father    Rheum arthritis Maternal Grandmother    Colon cancer Maternal Grandfather 43   Diabetes Paternal Grandmother    Sleep apnea Paternal Grandfather    Miscarriages / Stillbirths Maternal Aunt     No Known Allergies  Current Medications:   Current Outpatient Medications:    acetaZOLAMIDE  (DIAMOX ) 125 MG tablet, Start day of or before ascent. Take 125 mg twice daily; may be discontinued after staying at the same elevation for 2 to 4 days or if descent is initiated., Disp: 22 tablet, Rfl: 0   azithromycin  (ZITHROMAX ) 500 MG tablet, Take 1 tablet (500 mg total) by mouth daily., Disp: 3 tablet, Rfl: 0   fexofenadine (ALLEGRA) 180 MG tablet, Take 180 mg by mouth daily., Disp: , Rfl:    typhoid (VIVOTIF) DR capsule, Take 1 capsule by mouth every other day. Complete 1 week prior to travel., Disp: 4 capsule, Rfl: 0   Review of Systems:   Negative unless otherwise specified per HPI.  Vitals:   Vitals:   07/07/24 0815  BP: 110/72  Pulse: 69  Temp: (!) 97.5 F (36.4 C)  TempSrc: Temporal  SpO2: 98%  Weight: 220 lb (99.8 kg)  Height: 5' 6.25 (1.683 m)     Body mass index is 35.24 kg/m.  Physical Exam:   Physical Exam Constitutional:  Appearance: Normal appearance. She is well-developed.  HENT:     Head: Normocephalic and atraumatic.  Eyes:     General: Lids are normal.     Extraocular Movements: Extraocular movements intact.     Conjunctiva/sclera: Conjunctivae normal.  Pulmonary:     Effort: Pulmonary effort is normal.  Musculoskeletal:        General: Normal range of motion.     Cervical back: Normal range of motion and neck supple.  Skin:    General: Skin is warm and dry.  Neurological:     Mental Status: She is alert and oriented to person, place, and time.  Psychiatric:        Attention and Perception: Attention and perception normal.        Mood and Affect: Mood normal.        Behavior: Behavior normal.         Thought Content: Thought content normal.        Judgment: Judgment normal.     Assessment and Plan:   Assessment and Plan Assessment & Plan Travel health counseling She plans to travel to a high-altitude location in [place]. Prophylactic measures for altitude sickness and travel-related illnesses are required. She has received her influenza vaccination and requires typhoid vaccination. She prefers over-the-counter Dramamine for nausea and does not require medication for flight anxiety. - Prescribed acetazolamide  (Diamox ) for altitude sickness, to be taken twice daily starting the day of ascent and discontinued after staying at the same elevation for 2-4 days or upon descent. - Prescribed azithromycin  for potential traveler's diarrhea, with dosing instructions different from the traditional Z-Pak for respiratory illnesses. - Prescribed oral typhoid vaccine, to be taken on alternate days for a total of four doses, completed one week before travel. - Advised that the typhoid vaccine should be repeated every five years if not done within the past five years.   Lucie Buttner, PA-C

## 2024-07-08 LAB — HEPATITIS A ANTIBODY, TOTAL: Hepatitis A AB,Total: REACTIVE — AB

## 2024-07-09 ENCOUNTER — Ambulatory Visit: Payer: Self-pay | Admitting: Physician Assistant

## 2024-12-24 ENCOUNTER — Ambulatory Visit: Payer: BC Managed Care – PPO | Admitting: Obstetrics and Gynecology
